# Patient Record
Sex: Female | Born: 1945
Health system: Southern US, Community
[De-identification: ages and names within clinical notes are randomized; demographics above are authoritative.]

## PROBLEM LIST (undated history)

## (undated) DIAGNOSIS — M1990 Unspecified osteoarthritis, unspecified site: Secondary | ICD-10-CM

## (undated) DIAGNOSIS — K7689 Other specified diseases of liver: Secondary | ICD-10-CM

## (undated) DIAGNOSIS — R51 Headache: Secondary | ICD-10-CM

## (undated) DIAGNOSIS — D649 Anemia, unspecified: Secondary | ICD-10-CM

## (undated) DIAGNOSIS — M5430 Sciatica, unspecified side: Secondary | ICD-10-CM

## (undated) DIAGNOSIS — G930 Cerebral cysts: Secondary | ICD-10-CM

## (undated) DIAGNOSIS — K5790 Diverticulosis of intestine, part unspecified, without perforation or abscess without bleeding: Secondary | ICD-10-CM

## (undated) DIAGNOSIS — N189 Chronic kidney disease, unspecified: Secondary | ICD-10-CM

## (undated) HISTORY — DX: Diverticulosis of intestine, part unspecified, without perforation or abscess without bleeding: K57.90

## (undated) HISTORY — PX: TUBAL LIGATION: SHX77

## (undated) HISTORY — DX: Unspecified osteoarthritis, unspecified site: M19.90

## (undated) HISTORY — DX: Other specified diseases of liver: K76.89

## (undated) HISTORY — DX: Cerebral cysts: G93.0

## (undated) HISTORY — PX: ABDOMINAL HYSTERECTOMY: SHX81

## (undated) HISTORY — DX: Sciatica, unspecified side: M54.30

## (undated) HISTORY — PX: CHOLECYSTECTOMY: SHX55

## (undated) HISTORY — PX: OTHER SURGICAL HISTORY: SHX169

---

## 1996-01-06 HISTORY — PX: BRAIN SURGERY: SHX531

## 1997-10-02 ENCOUNTER — Other Ambulatory Visit: Admission: RE | Admit: 1997-10-02 | Discharge: 1997-10-02 | Payer: Self-pay | Admitting: Obstetrics and Gynecology

## 2000-01-20 ENCOUNTER — Other Ambulatory Visit: Admission: RE | Admit: 2000-01-20 | Discharge: 2000-01-20 | Payer: Self-pay | Admitting: Obstetrics and Gynecology

## 2001-09-01 ENCOUNTER — Other Ambulatory Visit: Admission: RE | Admit: 2001-09-01 | Discharge: 2001-09-01 | Payer: Self-pay | Admitting: Obstetrics and Gynecology

## 2002-12-19 ENCOUNTER — Other Ambulatory Visit: Admission: RE | Admit: 2002-12-19 | Discharge: 2002-12-19 | Payer: Self-pay | Admitting: Obstetrics and Gynecology

## 2004-03-13 ENCOUNTER — Other Ambulatory Visit: Admission: RE | Admit: 2004-03-13 | Discharge: 2004-03-13 | Payer: Self-pay | Admitting: Obstetrics and Gynecology

## 2011-07-06 DIAGNOSIS — M722 Plantar fascial fibromatosis: Secondary | ICD-10-CM | POA: Diagnosis not present

## 2011-08-03 DIAGNOSIS — M722 Plantar fascial fibromatosis: Secondary | ICD-10-CM | POA: Diagnosis not present

## 2011-12-17 DIAGNOSIS — H2589 Other age-related cataract: Secondary | ICD-10-CM | POA: Diagnosis not present

## 2011-12-17 DIAGNOSIS — H04129 Dry eye syndrome of unspecified lacrimal gland: Secondary | ICD-10-CM | POA: Diagnosis not present

## 2012-01-23 ENCOUNTER — Emergency Department (HOSPITAL_COMMUNITY)
Admission: EM | Admit: 2012-01-23 | Discharge: 2012-01-23 | Disposition: A | Discharge status: Home / Self Care | Payer: Medicare Other | Attending: Emergency Medicine | Admitting: Emergency Medicine

## 2012-01-23 ENCOUNTER — Encounter (HOSPITAL_COMMUNITY): Payer: Self-pay | Admitting: *Deleted

## 2012-01-23 ENCOUNTER — Emergency Department (HOSPITAL_COMMUNITY): Payer: Medicare Other

## 2012-01-23 DIAGNOSIS — R197 Diarrhea, unspecified: Secondary | ICD-10-CM | POA: Diagnosis not present

## 2012-01-23 DIAGNOSIS — R112 Nausea with vomiting, unspecified: Secondary | ICD-10-CM | POA: Diagnosis not present

## 2012-01-23 DIAGNOSIS — R6883 Chills (without fever): Secondary | ICD-10-CM | POA: Diagnosis not present

## 2012-01-23 DIAGNOSIS — K8020 Calculus of gallbladder without cholecystitis without obstruction: Secondary | ICD-10-CM | POA: Diagnosis not present

## 2012-01-23 DIAGNOSIS — N201 Calculus of ureter: Secondary | ICD-10-CM | POA: Diagnosis not present

## 2012-01-23 DIAGNOSIS — R1031 Right lower quadrant pain: Secondary | ICD-10-CM | POA: Diagnosis not present

## 2012-01-23 DIAGNOSIS — N200 Calculus of kidney: Secondary | ICD-10-CM | POA: Diagnosis not present

## 2012-01-23 LAB — COMPREHENSIVE METABOLIC PANEL
ALT: 15 U/L (ref 0–35)
AST: 14 U/L (ref 0–37)
Albumin: 4.4 g/dL (ref 3.5–5.2)
Alkaline Phosphatase: 106 U/L (ref 39–117)
BUN: 13 mg/dL (ref 6–23)
CO2: 24 mEq/L (ref 19–32)
Calcium: 10.2 mg/dL (ref 8.4–10.5)
Chloride: 99 mEq/L (ref 96–112)
Creatinine, Ser: 0.75 mg/dL (ref 0.50–1.10)
GFR calc Af Amer: >90 mL/min (ref >90)
GFR calc non Af Amer: 85 mL/min — ABNORMAL LOW (ref >90)
Glucose, Bld: 137 mg/dL — ABNORMAL HIGH (ref 70–99)
Potassium: 4 mEq/L (ref 3.5–5.1)
Sodium: 138 mEq/L (ref 135–145)
Total Bilirubin: 0.6 mg/dL (ref 0.3–1.2)
Total Protein: 7.7 g/dL (ref 6.0–8.3)

## 2012-01-23 LAB — CBC WITH DIFFERENTIAL/PLATELET
Basophils Absolute: 0 10*3/uL (ref 0.0–0.1)
Basophils Relative: 0 % (ref 0–1)
Eosinophils Absolute: 0 10*3/uL (ref 0.0–0.7)
Eosinophils Relative: 0 % (ref 0–5)
HCT: 43.9 % (ref 36.0–46.0)
Hemoglobin: 15.2 g/dL — ABNORMAL HIGH (ref 12.0–15.0)
Lymphocytes Relative: 8 % — ABNORMAL LOW (ref 12–46)
Lymphs Abs: 0.9 10*3/uL (ref 0.7–4.0)
MCH: 28.7 pg (ref 26.0–34.0)
MCHC: 34.6 g/dL (ref 30.0–36.0)
MCV: 83 fL (ref 78.0–100.0)
Monocytes Absolute: 0.3 10*3/uL (ref 0.1–1.0)
Monocytes Relative: 3 % (ref 3–12)
Neutro Abs: 10.4 10*3/uL — ABNORMAL HIGH (ref 1.7–7.7)
Neutrophils Relative %: 89 % — ABNORMAL HIGH (ref 43–77)
Platelets: 233 10*3/uL (ref 150–400)
RBC: 5.29 MIL/uL — ABNORMAL HIGH (ref 3.87–5.11)
RDW: 13.3 % (ref 11.5–15.5)
WBC: 11.7 10*3/uL — ABNORMAL HIGH (ref 4.0–10.5)

## 2012-01-23 LAB — URINALYSIS, ROUTINE W REFLEX MICROSCOPIC
Bilirubin Urine: NEGATIVE
Glucose, UA: NEGATIVE mg/dL
Ketones, ur: 40 mg/dL — AB
Leukocytes, UA: NEGATIVE
Nitrite: NEGATIVE
Protein, ur: NEGATIVE mg/dL
Specific Gravity, Urine: 1.012 (ref 1.005–1.030)
Urobilinogen, UA: 0.2 mg/dL (ref 0.0–1.0)
pH: 7 (ref 5.0–8.0)

## 2012-01-23 LAB — URINE MICROSCOPIC-ADD ON

## 2012-01-23 LAB — LIPASE, BLOOD: Lipase: 14 U/L (ref 11–59)

## 2012-01-23 MED ORDER — ONDANSETRON 4 MG PO TBDP
8.0000 mg | ORAL_TABLET | Freq: Once | ORAL | Status: AC
  Administered 2012-01-23: 8 mg via ORAL
  Filled 2012-01-23: qty 2

## 2012-01-23 MED ORDER — IOHEXOL 300 MG/ML  SOLN
100.0000 mL | Freq: Once | INTRAMUSCULAR | Status: AC | PRN
  Administered 2012-01-23: 100 mL via INTRAVENOUS

## 2012-01-23 MED ORDER — IOHEXOL 300 MG/ML  SOLN
50.0000 mL | Freq: Once | INTRAMUSCULAR | Status: AC | PRN
  Administered 2012-01-23: 50 mL via ORAL

## 2012-01-23 MED ORDER — ONDANSETRON HCL 4 MG/2ML IJ SOLN
4.0000 mg | Freq: Once | INTRAMUSCULAR | Status: AC
  Administered 2012-01-23: 4 mg via INTRAVENOUS
  Filled 2012-01-23: qty 2

## 2012-01-23 MED ORDER — HYDROMORPHONE HCL PF 1 MG/ML IJ SOLN
1.0000 mg | Freq: Once | INTRAMUSCULAR | Status: AC
  Administered 2012-01-23: 1 mg via INTRAVENOUS
  Filled 2012-01-23: qty 1

## 2012-01-23 MED ORDER — OXYCODONE-ACETAMINOPHEN 5-325 MG PO TABS: 1.0000 | ORAL_TABLET | ORAL | Status: AC | PRN

## 2012-01-23 MED ORDER — TAMSULOSIN HCL 0.4 MG PO CAPS: 0.4000 mg | ORAL_CAPSULE | Freq: Every day | ORAL | Status: DC

## 2012-01-23 NOTE — ED Notes (Signed)
Pt was seen at [facility].  Was given 12.5 mg IM of phenergan at 0330.

## 2012-01-23 NOTE — ED Notes (Signed)
meds given for active dry heaving in triage

## 2012-01-23 NOTE — ED Notes (Addendum)
Pt reports right lower quadrant pain that started this am, constant in nature, associated with nausea/vomiting. Pt was seen at an urgent care prior to this and discharged. Pt reports no relief from meds given there. Reports she is still having nausea.

## 2012-01-23 NOTE — ED Notes (Signed)
Updated family on labs.  Pt with emesis x 2 since zofran.

## 2012-01-23 NOTE — ED Provider Notes (Signed)
History     CSN: [id number]  Arrival date & time 01/23/12  1627   First MD Initiated Contact with Patient 01/23/12 1908      Chief Complaint  Patient presents with  . Abdominal Pain  . Nausea    (Consider location/radiation/quality/duration/timing/severity/associated sxs/prior treatment) Patient is a 67 y.o. female presenting with abdominal pain. The history is provided by the patient and a relative. No language interpreter was used.  Abdominal Pain The primary symptoms of the illness include abdominal pain, nausea, vomiting and diarrhea. Primary symptoms comment: Patient has been sick since Thursday, 2 days ago. Her illness started with vomiting and diarrhea. Today she has severe right lower quadrant abdominal pain. The current episode started 2 days ago. The onset of the illness was gradual. The problem has been rapidly worsening.  Associated with: Nothing. The patient states that she believes she is currently not pregnant. The patient has not had a change in bowel habit. Risk factors for an acute abdominal problem include being elderly and a history of abdominal surgery (She has had a prior hysterectomy.). Additional symptoms associated with the illness include chills.    No past medical history on file.  No past surgical history on file.  No family history on file.  History  Substance Use Topics  . Smoking status: Not on file  . Smokeless tobacco: Not on file  . Alcohol Use: Not on file    OB History    No data available      Review of Systems  Constitutional: Positive for chills.  HENT: Negative.   Eyes: Negative.   Respiratory: Negative.   Gastrointestinal: Positive for nausea, vomiting, abdominal pain and diarrhea.  Genitourinary: Negative.   Musculoskeletal: Negative.   Skin: Negative.   Neurological: Negative.   Psychiatric/Behavioral: Negative.     Allergies  Other  Home Medications  No current outpatient prescriptions on file.  BP 107/79  Pulse  75  Temp 97.6 F (36.4 C) (Oral)  Resp 16  SpO2 99%  Physical Exam  Nursing note and vitals reviewed. Constitutional: She is oriented to person, place, and time. She appears well-developed and well-nourished.       Slender elderly woman in acute distress with abdominal pain.  HENT:  Head: Normocephalic and atraumatic.  Right Ear: External ear normal.  Left Ear: External ear normal.  Mouth/Throat: Oropharynx is clear and moist.  Eyes: Conjunctivae normal and EOM are normal. Pupils are equal, round, and reactive to light.  Neck: Normal range of motion. Neck supple.  Cardiovascular: Normal rate, regular rhythm and normal heart sounds.   Pulmonary/Chest: Effort normal and breath sounds normal.  Abdominal: Soft.       Has RLQ tenderness, no rebound or rigidity.   Musculoskeletal: She exhibits no edema.  Neurological: She is alert and oriented to person, place, and time.       No sensory or motor deficit.  Skin: Skin is warm and dry.  Psychiatric: She has a normal mood and affect. Her behavior is normal.    ED Course  Procedures (including critical care time)  Labs Reviewed  CBC WITH DIFFERENTIAL - Abnormal; Notable for the following:    WBC 11.7 (*)     RBC 5.29 (*)     Hemoglobin 15.2 (*)     Neutrophils Relative 89 (*)     Neutro Abs 10.4 (*)     Lymphocytes Relative 8 (*)     All other components within normal limits  COMPREHENSIVE METABOLIC PANEL -  Abnormal; Notable for the following:    Glucose, Bld 137 (*)     GFR calc non Af Amer 85 (*)     All other components within normal limits  LIPASE, BLOOD  URINALYSIS, MICROSCOPIC ONLY  URINALYSIS, ROUTINE W REFLEX MICROSCOPIC    7:25 PM Pt seen --> physical exam performed.  IV medications for pain and nausea ordered.  CT abdomen/pelvis with oral and IV contrast ordered.  Cath UA ordered.  9:14 PM Pt is in CT now.  I reviewed her lab results with her daughter.  Her WBC is high at 11,700.    10:19 PM CT shows right  proximal ureteral stone.  Case discussed with Dr. Berneice Heinrich, urologist on call --> pain meds, Flomax, strain urines, be seen at [facility] next week.  1. Kidney stone           Carleene Cooper III, MD 01/23/12 2220

## 2012-01-23 NOTE — ED Notes (Signed)
Pt O2 sat decreased to 80s while pt sleeping after med admin, pt awakens to verbal stimuli & O2 sats increase, pt placed on 2L [place], will reassess

## 2012-01-28 ENCOUNTER — Other Ambulatory Visit: Payer: Self-pay | Admitting: Urology

## 2012-01-28 DIAGNOSIS — N201 Calculus of ureter: Secondary | ICD-10-CM | POA: Diagnosis not present

## 2012-01-28 DIAGNOSIS — N200 Calculus of kidney: Secondary | ICD-10-CM | POA: Diagnosis not present

## 2012-01-29 ENCOUNTER — Encounter (HOSPITAL_COMMUNITY): Payer: Self-pay | Admitting: Pharmacy Technician

## 2012-01-29 ENCOUNTER — Encounter (HOSPITAL_COMMUNITY): Payer: Self-pay | Admitting: *Deleted

## 2012-01-29 NOTE — Pre-Procedure Instructions (Signed)
Asked to bring blue folder the day of the procedure,insurance card,I.D. driver's license,wear comfortable clothing and have a driver for the day. Asked not to take Advil,Motrin,Ibuprofen,Aleve or any NSAIDS, Aspirin, or Toradol for 72 hours prior to procedure,  No vitamins or herbal medications 7 days prior to procedure. Instructed to take laxative per doctor's office instructions and eat a light dinner the evening before procedure.   To arrive at 0530  for lithotripsy procedure. 

## 2012-01-31 NOTE — H&P (Signed)
ctive Problems Problems  1. Nephrolithiasis 592.0 2. Proximal Ureteral Stone On The Right 592.1  History of Present Illness  Glenda Matthews is a 67 yo WF who is sent from the ER for a right proximal stone.  She had some loose stools on Thursday but it progressed to severe right flank with nausea and vomiting on Saturday.  She was seen in the Urgent Care in [place] and then she was sent to the  ER and a CT was obtained that shows a 6x8x80mm stone in the left proximal urine with evidence of a forniceal rupture.  She was management medically in the ER and was discharged home.  She has only used percocet x 1 since discharge.  Her pain is not too bad today.  She has had some urgency but no hematuria.  Her UA today is unremarkable.  A KUB today shows a 10mm stone in the area of the right proximal ureter.  She did have a tiny RLP stone as well on the scan and an 18mm calcified gallstone.  She has not had prior stones or recent UTI's.  She has had no GU surgery.   Past Medical History Problems  1. History of  Arthritis V13.4 2. History of  Esophageal Reflux 530.81 3. History of  Hydrocephalus 331.4  Surgical History Problems  1. History of  Brain Surgery 2. History of  Brain Surgery 3. History of  Hysterectomy V45.77  Current Meds 1. Percocet TABS; Therapy: (Recorded:23Jan2014) to 2. Tamsulosin HCl 0.4 MG Oral Capsule; Therapy: (Recorded:23Jan2014) to  Allergies Medication  1. PredniSONE TABS  Family History Problems  1. Maternal history of  Breast Cancer V16.3 2. Family history of  Death In The Family Father age 53 of heart disease 3. Son's history of  Diverticulitis Of Colon 4. Family history of  Family Health Status Number Of Children 1 son   2 daughters 5. Paternal history of  Heart Disease V17.49 6. Son's history of  Hernia 7. Daughter's history of  Type 1 Diabetes Mellitus  Social History Problems    Alcohol Use rarely   Caffeine Use rarely   Marital History - Widowed    Occupation: Retired Denied    History of  Tobacco Use 305.1  Review of Systems Genitourinary, constitutional, skin, eye, otolaryngeal, hematologic/lymphatic, cardiovascular, pulmonary, endocrine, musculoskeletal, gastrointestinal, neurological and psychiatric system(s) were reviewed and pertinent findings if present are noted.  Genitourinary: urinary frequency, urinary urgency and incontinence.  Gastrointestinal: nausea, vomiting, heartburn and diarrhea.  Constitutional: night sweats and feeling tired (fatigue).  ENT: sinus problems.  Musculoskeletal: joint pain.  Neurological: dizziness.  Psychiatric: anxiety.    Vitals Vital Signs [Data Includes: Last 1 Day]  23Jan2014 02:28PM  Blood Pressure: 136 / 86 Temperature: 97.2 F Heart Rate: 101 23Jan2014 02:22PM  BMI Calculated: 25.58 BSA Calculated: 1.63 Height: 5 ft 2 in Weight: 139 lb   Physical Exam Constitutional: Well nourished and well developed . No acute distress.  ENT:. The ears and nose are normal in appearance.  Neck: The appearance of the neck is normal and no neck mass is present.  Pulmonary: No respiratory distress and normal respiratory rhythm and effort.  Cardiovascular: Heart rate and rhythm are normal . No peripheral edema.  Abdomen: The abdomen is soft and nontender. No masses are palpated. No CVA tenderness. No hernias are palpable. No hepatosplenomegaly noted.  Lymphatics: The supraclavicular nodes are not enlarged or tender.  Skin: Normal skin turgor, no visible rash and no visible skin lesions.  Neuro/Psych:. Mood  and affect are appropriate.    Results/Data Urine [Data Includes: Last 1 Day]   23Jan2014  COLOR YELLOW   APPEARANCE CLEAR   SPECIFIC GRAVITY 1.010   pH 6.0   GLUCOSE NEG mg/dL  BILIRUBIN NEG   KETONE NEG mg/dL  BLOOD SMALL   PROTEIN NEG mg/dL  UROBILINOGEN 0.2 mg/dL  NITRITE NEG   LEUKOCYTE ESTERASE NEG   SQUAMOUS EPITHELIAL/HPF NONE SEEN   WBC NONE SEEN WBC/hpf  RBC 0-2 RBC/hpf    BACTERIA NONE SEEN   CRYSTALS NONE SEEN   CASTS NONE SEEN    Old records or history reviewed: I have reviewed her ER records and labs.  The following images/tracing/specimen were independently visualized:  I have reviewed her CT films from her recent visit and her KUB today shows a stable 10mm left proximal stone. There is retained contrast in the sigmoid colon with tics. there is a calcification in the RUQ that is probably her gallstone. The film is otherwise unremarkable.    Assessment Assessed  1. Proximal Ureteral Stone On The Right 592.1 2. Nephrolithiasis 592.0   She has an 6x8x17mm right proximal stone with minimal symptoms currently.   Plan Nephrolithiasis (592.0)  1. KUB  Done: 23Jan2014 12:00AM 2. Follow-up Schedule Surgery Office  Follow-up  Done: 23Jan2014 Proximal Ureteral Stone On The Right (592.1)  3. Ondansetron 4 MG Oral Tablet Dispersible; TAKE 1 TABLET Every 6 hours PRN nausea can take  1-2 tabs; Therapy: 23Jan2014 to (Last Rx:23Jan2014)  Requested for: 23Jan2014; Edited 4. Tamsulosin HCl 0.4 MG Oral Capsule; TAKE 1 CAPSULE Daily with meals; Therapy: 23Jan2014  to (Evaluate:20Feb2014)  Requested for: 23Jan2014; Last Rx:23Jan2014; Edited   I have discussed the options for therapy.  I don't think this stone will pass so MET is not appropriate.   I have discussed both ESWL and Ureteroscopy in detail and believe she would be a good candidate for ESWL.  I reviewed the risks of bleeding,infection, injury to the kidney or adjacent structures that can result in serious and even life threatening complications, failure of fragmentation or obstructing fragments possibly requiring secondary procedures, thrombotic events and sedation risks. I will try to set her up for Monday.  She has percocet and I will send in Zofran and tamsulosin to aid fragment passage.

## 2012-02-01 ENCOUNTER — Ambulatory Visit (HOSPITAL_COMMUNITY): Payer: Medicare Other

## 2012-02-01 ENCOUNTER — Ambulatory Visit (HOSPITAL_COMMUNITY)
Admission: RE | Admit: 2012-02-01 | Discharge: 2012-02-01 | Disposition: A | Discharge status: Home / Self Care | Payer: Medicare Other | Source: Ambulatory Visit | Attending: Urology | Admitting: Urology

## 2012-02-01 ENCOUNTER — Encounter (HOSPITAL_COMMUNITY): Payer: Self-pay | Admitting: *Deleted

## 2012-02-01 ENCOUNTER — Encounter (HOSPITAL_COMMUNITY)
Admission: RE | Disposition: A | Discharge status: Home / Self Care | Payer: Self-pay | Source: Ambulatory Visit | Attending: Urology

## 2012-02-01 DIAGNOSIS — Z01818 Encounter for other preprocedural examination: Secondary | ICD-10-CM | POA: Diagnosis not present

## 2012-02-01 DIAGNOSIS — G911 Obstructive hydrocephalus: Secondary | ICD-10-CM | POA: Diagnosis not present

## 2012-02-01 DIAGNOSIS — Z79899 Other long term (current) drug therapy: Secondary | ICD-10-CM | POA: Diagnosis not present

## 2012-02-01 DIAGNOSIS — I499 Cardiac arrhythmia, unspecified: Secondary | ICD-10-CM | POA: Diagnosis not present

## 2012-02-01 DIAGNOSIS — K219 Gastro-esophageal reflux disease without esophagitis: Secondary | ICD-10-CM | POA: Diagnosis not present

## 2012-02-01 DIAGNOSIS — N201 Calculus of ureter: Secondary | ICD-10-CM | POA: Diagnosis not present

## 2012-02-01 DIAGNOSIS — N200 Calculus of kidney: Secondary | ICD-10-CM | POA: Insufficient documentation

## 2012-02-01 HISTORY — DX: Headache: R51

## 2012-02-01 HISTORY — DX: Anemia, unspecified: D64.9

## 2012-02-01 HISTORY — DX: Chronic kidney disease, unspecified: N18.9

## 2012-02-01 HISTORY — DX: Unspecified osteoarthritis, unspecified site: M19.90

## 2012-02-01 SURGERY — LITHOTRIPSY, ESWL
Anesthesia: LOCAL | Laterality: Right

## 2012-02-01 MED ORDER — SODIUM CHLORIDE 0.9 % IV SOLN: 250.0000 mL | INTRAVENOUS | Status: DC | PRN

## 2012-02-01 MED ORDER — OXYCODONE HCL 5 MG PO TABS: 5.0000 mg | ORAL_TABLET | ORAL | Status: DC | PRN

## 2012-02-01 MED ORDER — SODIUM CHLORIDE 0.9 % IJ SOLN: 3.0000 mL | Freq: Two times a day (BID) | INTRAMUSCULAR | Status: DC

## 2012-02-01 MED ORDER — DEXTROSE-NACL 5-0.45 % IV SOLN
INTRAVENOUS | Status: DC
  Administered 2012-02-01: 07:00:00 via INTRAVENOUS

## 2012-02-01 MED ORDER — FENTANYL CITRATE 0.05 MG/ML IJ SOLN: 25.0000 ug | INTRAMUSCULAR | Status: DC | PRN

## 2012-02-01 MED ORDER — ONDANSETRON HCL 4 MG/2ML IJ SOLN: 4.0000 mg | Freq: Four times a day (QID) | INTRAMUSCULAR | Status: DC | PRN

## 2012-02-01 MED ORDER — ACETAMINOPHEN 325 MG PO TABS: 650.0000 mg | ORAL_TABLET | ORAL | Status: DC | PRN

## 2012-02-01 MED ORDER — ACETAMINOPHEN 650 MG RE SUPP: 650.0000 mg | RECTAL | Status: DC | PRN

## 2012-02-01 MED ORDER — DIPHENHYDRAMINE HCL 25 MG PO CAPS
25.0000 mg | ORAL_CAPSULE | ORAL | Status: AC
  Administered 2012-02-01: 25 mg via ORAL
  Filled 2012-02-01: qty 1

## 2012-02-01 MED ORDER — SODIUM CHLORIDE 0.9 % IJ SOLN: 3.0000 mL | INTRAMUSCULAR | Status: DC | PRN

## 2012-02-01 MED ORDER — DIAZEPAM 5 MG PO TABS
10.0000 mg | ORAL_TABLET | ORAL | Status: AC
  Administered 2012-02-01: 10 mg via ORAL
  Filled 2012-02-01: qty 2

## 2012-02-01 MED ORDER — CIPROFLOXACIN HCL 500 MG PO TABS
500.0000 mg | ORAL_TABLET | ORAL | Status: AC
Start: 2012-02-01 — End: 2012-02-01
  Administered 2012-02-01: 500 mg via ORAL
  Filled 2012-02-01: qty 1

## 2012-02-01 NOTE — Progress Notes (Signed)
Patient up to restroom without complaints. Alert and Oriented x4. Slightly groggy, but states she feel fine and denies pain. Patient and patient's daughter verbalize understand of discharge instructions. Instructed to call Dr. Annabell Howells for any concerns following discharge home.

## 2012-02-01 NOTE — Interval H&P Note (Signed)
History and Physical Interval Note:  02/01/2012 7:38 AM  Glenda Matthews  has presented today for surgery, with the diagnosis of RIGHT PROXIMAL STONE  The various methods of treatment have been discussed with the patient and family. After consideration of risks, benefits and other options for treatment, the patient has consented to  Procedure(s) (LRB) with comments: EXTRACORPOREAL SHOCK WAVE LITHOTRIPSY (ESWL) (Right) as a surgical intervention .  The patient's history has been reviewed, patient examined, no change in status, stable for surgery.  I have reviewed the patient's chart and labs.  Questions were answered to the patient's satisfaction.     [name] J  Her stone has dropped back into the RLP on KUB this morning.

## 2012-02-15 DIAGNOSIS — N200 Calculus of kidney: Secondary | ICD-10-CM | POA: Diagnosis not present

## 2012-02-15 DIAGNOSIS — N201 Calculus of ureter: Secondary | ICD-10-CM | POA: Diagnosis not present

## 2012-03-10 DIAGNOSIS — Z1289 Encounter for screening for malignant neoplasm of other sites: Secondary | ICD-10-CM | POA: Diagnosis not present

## 2012-03-10 DIAGNOSIS — R109 Unspecified abdominal pain: Secondary | ICD-10-CM | POA: Diagnosis not present

## 2012-03-10 DIAGNOSIS — Z1231 Encounter for screening mammogram for malignant neoplasm of breast: Secondary | ICD-10-CM | POA: Diagnosis not present

## 2012-03-18 DIAGNOSIS — K5730 Diverticulosis of large intestine without perforation or abscess without bleeding: Secondary | ICD-10-CM | POA: Diagnosis not present

## 2012-03-18 DIAGNOSIS — R109 Unspecified abdominal pain: Secondary | ICD-10-CM | POA: Diagnosis not present

## 2012-03-18 DIAGNOSIS — R932 Abnormal findings on diagnostic imaging of liver and biliary tract: Secondary | ICD-10-CM | POA: Diagnosis not present

## 2012-04-13 DIAGNOSIS — K5732 Diverticulitis of large intestine without perforation or abscess without bleeding: Secondary | ICD-10-CM | POA: Diagnosis not present

## 2012-04-13 DIAGNOSIS — R933 Abnormal findings on diagnostic imaging of other parts of digestive tract: Secondary | ICD-10-CM | POA: Diagnosis not present

## 2012-04-13 DIAGNOSIS — K5730 Diverticulosis of large intestine without perforation or abscess without bleeding: Secondary | ICD-10-CM | POA: Diagnosis not present

## 2012-08-29 DIAGNOSIS — K5730 Diverticulosis of large intestine without perforation or abscess without bleeding: Secondary | ICD-10-CM | POA: Diagnosis not present

## 2012-08-29 DIAGNOSIS — R932 Abnormal findings on diagnostic imaging of liver and biliary tract: Secondary | ICD-10-CM | POA: Diagnosis not present

## 2012-08-29 DIAGNOSIS — R109 Unspecified abdominal pain: Secondary | ICD-10-CM | POA: Diagnosis not present

## 2012-11-02 DIAGNOSIS — N760 Acute vaginitis: Secondary | ICD-10-CM | POA: Diagnosis not present

## 2012-11-02 DIAGNOSIS — D280 Benign neoplasm of vulva: Secondary | ICD-10-CM | POA: Diagnosis not present

## 2012-11-02 DIAGNOSIS — L909 Atrophic disorder of skin, unspecified: Secondary | ICD-10-CM | POA: Diagnosis not present

## 2012-11-04 DIAGNOSIS — M5430 Sciatica, unspecified side: Secondary | ICD-10-CM | POA: Diagnosis not present

## 2012-11-07 DIAGNOSIS — K5730 Diverticulosis of large intestine without perforation or abscess without bleeding: Secondary | ICD-10-CM | POA: Diagnosis not present

## 2012-11-07 DIAGNOSIS — R109 Unspecified abdominal pain: Secondary | ICD-10-CM | POA: Diagnosis not present

## 2012-11-07 DIAGNOSIS — R932 Abnormal findings on diagnostic imaging of liver and biliary tract: Secondary | ICD-10-CM | POA: Diagnosis not present

## 2012-12-19 DIAGNOSIS — J019 Acute sinusitis, unspecified: Secondary | ICD-10-CM | POA: Diagnosis not present

## 2012-12-26 DIAGNOSIS — H2589 Other age-related cataract: Secondary | ICD-10-CM | POA: Diagnosis not present

## 2012-12-26 DIAGNOSIS — H18519 Endothelial corneal dystrophy, unspecified eye: Secondary | ICD-10-CM | POA: Diagnosis not present

## 2012-12-26 DIAGNOSIS — H33309 Unspecified retinal break, unspecified eye: Secondary | ICD-10-CM | POA: Diagnosis not present

## 2012-12-26 DIAGNOSIS — H43819 Vitreous degeneration, unspecified eye: Secondary | ICD-10-CM | POA: Diagnosis not present

## 2013-02-01 DIAGNOSIS — H2510 Age-related nuclear cataract, unspecified eye: Secondary | ICD-10-CM | POA: Diagnosis not present

## 2013-02-01 DIAGNOSIS — H35439 Paving stone degeneration of retina, unspecified eye: Secondary | ICD-10-CM | POA: Diagnosis not present

## 2013-02-01 DIAGNOSIS — H43819 Vitreous degeneration, unspecified eye: Secondary | ICD-10-CM | POA: Diagnosis not present

## 2013-02-14 DIAGNOSIS — H33309 Unspecified retinal break, unspecified eye: Secondary | ICD-10-CM | POA: Diagnosis not present

## 2013-02-14 DIAGNOSIS — H2589 Other age-related cataract: Secondary | ICD-10-CM | POA: Diagnosis not present

## 2013-02-14 DIAGNOSIS — H18519 Endothelial corneal dystrophy, unspecified eye: Secondary | ICD-10-CM | POA: Diagnosis not present

## 2013-02-14 DIAGNOSIS — H31009 Unspecified chorioretinal scars, unspecified eye: Secondary | ICD-10-CM | POA: Diagnosis not present

## 2014-05-11 DIAGNOSIS — H26051 Posterior subcapsular polar infantile and juvenile cataract, right eye: Secondary | ICD-10-CM | POA: Diagnosis not present

## 2014-05-11 DIAGNOSIS — H52223 Regular astigmatism, bilateral: Secondary | ICD-10-CM | POA: Diagnosis not present

## 2014-05-11 DIAGNOSIS — H43813 Vitreous degeneration, bilateral: Secondary | ICD-10-CM | POA: Diagnosis not present

## 2014-05-11 DIAGNOSIS — H5053 Vertical heterophoria: Secondary | ICD-10-CM | POA: Diagnosis not present

## 2014-05-11 DIAGNOSIS — H43313 Vitreous membranes and strands, bilateral: Secondary | ICD-10-CM | POA: Diagnosis not present

## 2014-05-11 DIAGNOSIS — H524 Presbyopia: Secondary | ICD-10-CM | POA: Diagnosis not present

## 2014-05-11 DIAGNOSIS — H25812 Combined forms of age-related cataract, left eye: Secondary | ICD-10-CM | POA: Diagnosis not present

## 2014-05-22 DIAGNOSIS — Z982 Presence of cerebrospinal fluid drainage device: Secondary | ICD-10-CM | POA: Diagnosis not present

## 2014-05-22 DIAGNOSIS — R12 Heartburn: Secondary | ICD-10-CM | POA: Diagnosis not present

## 2014-05-22 DIAGNOSIS — R079 Chest pain, unspecified: Secondary | ICD-10-CM | POA: Diagnosis not present

## 2014-06-08 DIAGNOSIS — R51 Headache: Secondary | ICD-10-CM | POA: Diagnosis not present

## 2014-06-08 DIAGNOSIS — R42 Dizziness and giddiness: Secondary | ICD-10-CM | POA: Diagnosis not present

## 2014-06-14 DIAGNOSIS — R5383 Other fatigue: Secondary | ICD-10-CM | POA: Diagnosis not present

## 2014-06-14 DIAGNOSIS — Z0001 Encounter for general adult medical examination with abnormal findings: Secondary | ICD-10-CM | POA: Diagnosis not present

## 2014-06-14 DIAGNOSIS — Z982 Presence of cerebrospinal fluid drainage device: Secondary | ICD-10-CM | POA: Diagnosis not present

## 2014-06-14 DIAGNOSIS — R51 Headache: Secondary | ICD-10-CM | POA: Diagnosis not present

## 2014-06-14 DIAGNOSIS — Z13220 Encounter for screening for lipoid disorders: Secondary | ICD-10-CM | POA: Diagnosis not present

## 2014-06-14 DIAGNOSIS — R42 Dizziness and giddiness: Secondary | ICD-10-CM | POA: Diagnosis not present

## 2014-07-18 DIAGNOSIS — D485 Neoplasm of uncertain behavior of skin: Secondary | ICD-10-CM | POA: Diagnosis not present

## 2014-08-14 DIAGNOSIS — C44519 Basal cell carcinoma of skin of other part of trunk: Secondary | ICD-10-CM | POA: Diagnosis not present

## 2014-08-14 DIAGNOSIS — L821 Other seborrheic keratosis: Secondary | ICD-10-CM | POA: Diagnosis not present

## 2014-08-14 DIAGNOSIS — D2339 Other benign neoplasm of skin of other parts of face: Secondary | ICD-10-CM | POA: Diagnosis not present

## 2014-08-14 DIAGNOSIS — D485 Neoplasm of uncertain behavior of skin: Secondary | ICD-10-CM | POA: Diagnosis not present

## 2014-08-14 DIAGNOSIS — D225 Melanocytic nevi of trunk: Secondary | ICD-10-CM | POA: Diagnosis not present

## 2014-08-20 DIAGNOSIS — D485 Neoplasm of uncertain behavior of skin: Secondary | ICD-10-CM | POA: Diagnosis not present

## 2014-11-21 DIAGNOSIS — Z85828 Personal history of other malignant neoplasm of skin: Secondary | ICD-10-CM | POA: Diagnosis not present

## 2014-11-21 DIAGNOSIS — Z08 Encounter for follow-up examination after completed treatment for malignant neoplasm: Secondary | ICD-10-CM | POA: Diagnosis not present

## 2014-11-21 DIAGNOSIS — L249 Irritant contact dermatitis, unspecified cause: Secondary | ICD-10-CM | POA: Diagnosis not present

## 2015-02-21 DIAGNOSIS — R1031 Right lower quadrant pain: Secondary | ICD-10-CM | POA: Diagnosis not present

## 2015-03-19 DIAGNOSIS — R0602 Shortness of breath: Secondary | ICD-10-CM | POA: Diagnosis not present

## 2015-03-19 DIAGNOSIS — R0789 Other chest pain: Secondary | ICD-10-CM | POA: Diagnosis not present

## 2015-03-19 DIAGNOSIS — R072 Precordial pain: Secondary | ICD-10-CM | POA: Diagnosis not present

## 2015-03-19 DIAGNOSIS — K219 Gastro-esophageal reflux disease without esophagitis: Secondary | ICD-10-CM | POA: Diagnosis not present

## 2015-03-19 DIAGNOSIS — R911 Solitary pulmonary nodule: Secondary | ICD-10-CM | POA: Diagnosis not present

## 2015-03-19 DIAGNOSIS — K5730 Diverticulosis of large intestine without perforation or abscess without bleeding: Secondary | ICD-10-CM | POA: Diagnosis not present

## 2015-03-19 DIAGNOSIS — K8020 Calculus of gallbladder without cholecystitis without obstruction: Secondary | ICD-10-CM | POA: Diagnosis not present

## 2015-03-19 DIAGNOSIS — E785 Hyperlipidemia, unspecified: Secondary | ICD-10-CM | POA: Diagnosis not present

## 2015-03-19 DIAGNOSIS — R079 Chest pain, unspecified: Secondary | ICD-10-CM | POA: Diagnosis not present

## 2015-03-20 DIAGNOSIS — R079 Chest pain, unspecified: Secondary | ICD-10-CM | POA: Diagnosis not present

## 2015-03-28 DIAGNOSIS — K8020 Calculus of gallbladder without cholecystitis without obstruction: Secondary | ICD-10-CM | POA: Diagnosis not present

## 2015-04-10 DIAGNOSIS — R1013 Epigastric pain: Secondary | ICD-10-CM | POA: Diagnosis not present

## 2015-04-10 DIAGNOSIS — K811 Chronic cholecystitis: Secondary | ICD-10-CM | POA: Diagnosis not present

## 2015-04-10 DIAGNOSIS — R110 Nausea: Secondary | ICD-10-CM | POA: Diagnosis not present

## 2015-04-10 DIAGNOSIS — R1011 Right upper quadrant pain: Secondary | ICD-10-CM | POA: Diagnosis not present

## 2015-04-11 DIAGNOSIS — Z01818 Encounter for other preprocedural examination: Secondary | ICD-10-CM | POA: Diagnosis not present

## 2015-04-15 DIAGNOSIS — R1013 Epigastric pain: Secondary | ICD-10-CM | POA: Diagnosis not present

## 2015-04-15 DIAGNOSIS — N189 Chronic kidney disease, unspecified: Secondary | ICD-10-CM | POA: Diagnosis not present

## 2015-04-15 DIAGNOSIS — Z87891 Personal history of nicotine dependence: Secondary | ICD-10-CM | POA: Diagnosis not present

## 2015-04-15 DIAGNOSIS — Z79899 Other long term (current) drug therapy: Secondary | ICD-10-CM | POA: Diagnosis not present

## 2015-04-15 DIAGNOSIS — K8010 Calculus of gallbladder with chronic cholecystitis without obstruction: Secondary | ICD-10-CM | POA: Diagnosis not present

## 2015-04-15 DIAGNOSIS — K811 Chronic cholecystitis: Secondary | ICD-10-CM | POA: Diagnosis not present

## 2015-04-15 DIAGNOSIS — K219 Gastro-esophageal reflux disease without esophagitis: Secondary | ICD-10-CM | POA: Diagnosis not present

## 2015-04-16 DIAGNOSIS — Z87891 Personal history of nicotine dependence: Secondary | ICD-10-CM | POA: Diagnosis not present

## 2015-04-16 DIAGNOSIS — K8010 Calculus of gallbladder with chronic cholecystitis without obstruction: Secondary | ICD-10-CM | POA: Diagnosis not present

## 2015-04-16 DIAGNOSIS — K219 Gastro-esophageal reflux disease without esophagitis: Secondary | ICD-10-CM | POA: Diagnosis not present

## 2015-04-16 DIAGNOSIS — Z79899 Other long term (current) drug therapy: Secondary | ICD-10-CM | POA: Diagnosis not present

## 2015-04-16 DIAGNOSIS — N189 Chronic kidney disease, unspecified: Secondary | ICD-10-CM | POA: Diagnosis not present

## 2015-07-23 DIAGNOSIS — J0101 Acute recurrent maxillary sinusitis: Secondary | ICD-10-CM | POA: Diagnosis not present

## 2015-07-23 DIAGNOSIS — M5431 Sciatica, right side: Secondary | ICD-10-CM | POA: Diagnosis not present

## 2015-08-01 DIAGNOSIS — C44519 Basal cell carcinoma of skin of other part of trunk: Secondary | ICD-10-CM | POA: Diagnosis not present

## 2015-09-06 DIAGNOSIS — R1030 Lower abdominal pain, unspecified: Secondary | ICD-10-CM | POA: Diagnosis not present

## 2015-09-06 DIAGNOSIS — R300 Dysuria: Secondary | ICD-10-CM | POA: Diagnosis not present

## 2015-09-06 DIAGNOSIS — R911 Solitary pulmonary nodule: Secondary | ICD-10-CM | POA: Diagnosis not present

## 2015-10-16 DIAGNOSIS — D1801 Hemangioma of skin and subcutaneous tissue: Secondary | ICD-10-CM | POA: Diagnosis not present

## 2015-10-16 DIAGNOSIS — Z85828 Personal history of other malignant neoplasm of skin: Secondary | ICD-10-CM | POA: Diagnosis not present

## 2015-10-16 DIAGNOSIS — D485 Neoplasm of uncertain behavior of skin: Secondary | ICD-10-CM | POA: Diagnosis not present

## 2015-10-16 DIAGNOSIS — L821 Other seborrheic keratosis: Secondary | ICD-10-CM | POA: Diagnosis not present

## 2015-10-16 DIAGNOSIS — L814 Other melanin hyperpigmentation: Secondary | ICD-10-CM | POA: Diagnosis not present

## 2015-10-16 DIAGNOSIS — L570 Actinic keratosis: Secondary | ICD-10-CM | POA: Diagnosis not present

## 2015-10-21 DIAGNOSIS — I700 Atherosclerosis of aorta: Secondary | ICD-10-CM | POA: Diagnosis not present

## 2015-10-21 DIAGNOSIS — R911 Solitary pulmonary nodule: Secondary | ICD-10-CM | POA: Diagnosis not present

## 2015-12-19 DIAGNOSIS — L570 Actinic keratosis: Secondary | ICD-10-CM | POA: Diagnosis not present

## 2016-02-07 DIAGNOSIS — R109 Unspecified abdominal pain: Secondary | ICD-10-CM | POA: Diagnosis not present

## 2016-02-07 DIAGNOSIS — R197 Diarrhea, unspecified: Secondary | ICD-10-CM | POA: Diagnosis not present

## 2016-02-07 DIAGNOSIS — E860 Dehydration: Secondary | ICD-10-CM | POA: Diagnosis not present

## 2016-03-19 DIAGNOSIS — Z982 Presence of cerebrospinal fluid drainage device: Secondary | ICD-10-CM | POA: Diagnosis not present

## 2016-03-19 DIAGNOSIS — R42 Dizziness and giddiness: Secondary | ICD-10-CM | POA: Diagnosis not present

## 2016-03-19 DIAGNOSIS — R1011 Right upper quadrant pain: Secondary | ICD-10-CM | POA: Diagnosis not present

## 2016-03-24 DIAGNOSIS — R109 Unspecified abdominal pain: Secondary | ICD-10-CM | POA: Diagnosis not present

## 2016-03-24 DIAGNOSIS — R110 Nausea: Secondary | ICD-10-CM | POA: Diagnosis not present

## 2016-04-01 DIAGNOSIS — G919 Hydrocephalus, unspecified: Secondary | ICD-10-CM | POA: Diagnosis not present

## 2016-04-01 DIAGNOSIS — Z6827 Body mass index (BMI) 27.0-27.9, adult: Secondary | ICD-10-CM | POA: Diagnosis not present

## 2016-04-02 ENCOUNTER — Other Ambulatory Visit: Payer: Self-pay | Admitting: Neurosurgery

## 2016-04-02 DIAGNOSIS — G919 Hydrocephalus, unspecified: Secondary | ICD-10-CM

## 2016-04-03 ENCOUNTER — Ambulatory Visit
Admission: RE | Admit: 2016-04-03 | Discharge: 2016-04-03 | Disposition: A | Discharge status: Home / Self Care | Payer: Medicare Other | Source: Ambulatory Visit | Attending: Neurosurgery | Admitting: Neurosurgery

## 2016-04-03 DIAGNOSIS — G919 Hydrocephalus, unspecified: Secondary | ICD-10-CM

## 2016-04-03 DIAGNOSIS — R51 Headache: Secondary | ICD-10-CM | POA: Diagnosis not present

## 2016-04-15 DIAGNOSIS — D485 Neoplasm of uncertain behavior of skin: Secondary | ICD-10-CM | POA: Diagnosis not present

## 2016-04-15 DIAGNOSIS — L821 Other seborrheic keratosis: Secondary | ICD-10-CM | POA: Diagnosis not present

## 2016-04-15 DIAGNOSIS — Z85828 Personal history of other malignant neoplasm of skin: Secondary | ICD-10-CM | POA: Diagnosis not present

## 2016-04-15 DIAGNOSIS — L570 Actinic keratosis: Secondary | ICD-10-CM | POA: Diagnosis not present

## 2016-04-15 DIAGNOSIS — C4441 Basal cell carcinoma of skin of scalp and neck: Secondary | ICD-10-CM | POA: Diagnosis not present

## 2016-04-15 DIAGNOSIS — L814 Other melanin hyperpigmentation: Secondary | ICD-10-CM | POA: Diagnosis not present

## 2016-04-15 DIAGNOSIS — C44519 Basal cell carcinoma of skin of other part of trunk: Secondary | ICD-10-CM | POA: Diagnosis not present

## 2016-04-15 DIAGNOSIS — D1801 Hemangioma of skin and subcutaneous tissue: Secondary | ICD-10-CM | POA: Diagnosis not present

## 2016-04-22 DIAGNOSIS — G919 Hydrocephalus, unspecified: Secondary | ICD-10-CM | POA: Diagnosis not present

## 2016-05-07 DIAGNOSIS — I700 Atherosclerosis of aorta: Secondary | ICD-10-CM | POA: Diagnosis not present

## 2016-05-07 DIAGNOSIS — R109 Unspecified abdominal pain: Secondary | ICD-10-CM | POA: Diagnosis not present

## 2016-05-07 DIAGNOSIS — N281 Cyst of kidney, acquired: Secondary | ICD-10-CM | POA: Diagnosis not present

## 2016-05-07 DIAGNOSIS — K7689 Other specified diseases of liver: Secondary | ICD-10-CM | POA: Diagnosis not present

## 2016-05-07 DIAGNOSIS — Z79899 Other long term (current) drug therapy: Secondary | ICD-10-CM | POA: Diagnosis not present

## 2016-06-22 DIAGNOSIS — C4441 Basal cell carcinoma of skin of scalp and neck: Secondary | ICD-10-CM | POA: Diagnosis not present

## 2016-06-22 DIAGNOSIS — C44519 Basal cell carcinoma of skin of other part of trunk: Secondary | ICD-10-CM | POA: Diagnosis not present

## 2016-06-22 DIAGNOSIS — L905 Scar conditions and fibrosis of skin: Secondary | ICD-10-CM | POA: Diagnosis not present

## 2016-06-25 DIAGNOSIS — L905 Scar conditions and fibrosis of skin: Secondary | ICD-10-CM | POA: Diagnosis not present

## 2016-07-24 ENCOUNTER — Other Ambulatory Visit: Payer: Self-pay

## 2016-08-13 DIAGNOSIS — R911 Solitary pulmonary nodule: Secondary | ICD-10-CM | POA: Diagnosis not present

## 2016-08-13 DIAGNOSIS — N281 Cyst of kidney, acquired: Secondary | ICD-10-CM | POA: Diagnosis not present

## 2016-08-13 DIAGNOSIS — R1030 Lower abdominal pain, unspecified: Secondary | ICD-10-CM | POA: Diagnosis not present

## 2016-08-13 DIAGNOSIS — R197 Diarrhea, unspecified: Secondary | ICD-10-CM | POA: Diagnosis not present

## 2016-08-13 DIAGNOSIS — K7689 Other specified diseases of liver: Secondary | ICD-10-CM | POA: Diagnosis not present

## 2016-11-09 DIAGNOSIS — H524 Presbyopia: Secondary | ICD-10-CM | POA: Diagnosis not present

## 2016-11-09 DIAGNOSIS — H25813 Combined forms of age-related cataract, bilateral: Secondary | ICD-10-CM | POA: Diagnosis not present

## 2016-11-09 DIAGNOSIS — H5211 Myopia, right eye: Secondary | ICD-10-CM | POA: Diagnosis not present

## 2016-11-09 DIAGNOSIS — R911 Solitary pulmonary nodule: Secondary | ICD-10-CM | POA: Diagnosis not present

## 2016-11-09 DIAGNOSIS — H52223 Regular astigmatism, bilateral: Secondary | ICD-10-CM | POA: Diagnosis not present

## 2016-11-09 DIAGNOSIS — I700 Atherosclerosis of aorta: Secondary | ICD-10-CM | POA: Diagnosis not present

## 2016-11-17 DIAGNOSIS — J069 Acute upper respiratory infection, unspecified: Secondary | ICD-10-CM | POA: Diagnosis not present

## 2016-11-17 DIAGNOSIS — K219 Gastro-esophageal reflux disease without esophagitis: Secondary | ICD-10-CM | POA: Diagnosis not present

## 2016-11-17 DIAGNOSIS — M549 Dorsalgia, unspecified: Secondary | ICD-10-CM | POA: Diagnosis not present

## 2016-11-17 DIAGNOSIS — R05 Cough: Secondary | ICD-10-CM | POA: Diagnosis not present

## 2017-01-14 DIAGNOSIS — H52223 Regular astigmatism, bilateral: Secondary | ICD-10-CM | POA: Diagnosis not present

## 2017-01-14 DIAGNOSIS — H1851 Endothelial corneal dystrophy: Secondary | ICD-10-CM | POA: Diagnosis not present

## 2017-01-14 DIAGNOSIS — H5213 Myopia, bilateral: Secondary | ICD-10-CM | POA: Diagnosis not present

## 2017-01-14 DIAGNOSIS — H524 Presbyopia: Secondary | ICD-10-CM | POA: Diagnosis not present

## 2017-01-14 DIAGNOSIS — H25813 Combined forms of age-related cataract, bilateral: Secondary | ICD-10-CM | POA: Diagnosis not present

## 2017-01-14 DIAGNOSIS — H25093 Other age-related incipient cataract, bilateral: Secondary | ICD-10-CM | POA: Diagnosis not present

## 2017-02-24 DIAGNOSIS — J069 Acute upper respiratory infection, unspecified: Secondary | ICD-10-CM | POA: Diagnosis not present

## 2017-03-16 DIAGNOSIS — M26609 Unspecified temporomandibular joint disorder, unspecified side: Secondary | ICD-10-CM | POA: Diagnosis not present

## 2017-03-16 DIAGNOSIS — Z1231 Encounter for screening mammogram for malignant neoplasm of breast: Secondary | ICD-10-CM | POA: Diagnosis not present

## 2017-03-16 DIAGNOSIS — E2839 Other primary ovarian failure: Secondary | ICD-10-CM | POA: Diagnosis not present

## 2017-03-16 DIAGNOSIS — Z0001 Encounter for general adult medical examination with abnormal findings: Secondary | ICD-10-CM | POA: Diagnosis not present

## 2017-03-16 DIAGNOSIS — Z1389 Encounter for screening for other disorder: Secondary | ICD-10-CM | POA: Diagnosis not present

## 2017-03-16 DIAGNOSIS — M549 Dorsalgia, unspecified: Secondary | ICD-10-CM | POA: Diagnosis not present

## 2017-03-16 DIAGNOSIS — Z1331 Encounter for screening for depression: Secondary | ICD-10-CM | POA: Diagnosis not present

## 2017-03-16 DIAGNOSIS — H6981 Other specified disorders of Eustachian tube, right ear: Secondary | ICD-10-CM | POA: Diagnosis not present

## 2017-03-16 DIAGNOSIS — Z23 Encounter for immunization: Secondary | ICD-10-CM | POA: Diagnosis not present

## 2017-03-22 DIAGNOSIS — M549 Dorsalgia, unspecified: Secondary | ICD-10-CM | POA: Diagnosis not present

## 2017-03-22 DIAGNOSIS — M4804 Spinal stenosis, thoracic region: Secondary | ICD-10-CM | POA: Diagnosis not present

## 2017-03-22 DIAGNOSIS — M47814 Spondylosis without myelopathy or radiculopathy, thoracic region: Secondary | ICD-10-CM | POA: Diagnosis not present

## 2017-03-22 DIAGNOSIS — M4184 Other forms of scoliosis, thoracic region: Secondary | ICD-10-CM | POA: Diagnosis not present

## 2017-03-22 DIAGNOSIS — M5136 Other intervertebral disc degeneration, lumbar region: Secondary | ICD-10-CM | POA: Diagnosis not present

## 2017-04-13 DIAGNOSIS — M549 Dorsalgia, unspecified: Secondary | ICD-10-CM | POA: Diagnosis not present

## 2017-04-13 DIAGNOSIS — M2550 Pain in unspecified joint: Secondary | ICD-10-CM | POA: Diagnosis not present

## 2017-04-13 DIAGNOSIS — R5383 Other fatigue: Secondary | ICD-10-CM | POA: Diagnosis not present

## 2017-04-13 DIAGNOSIS — M791 Myalgia, unspecified site: Secondary | ICD-10-CM | POA: Diagnosis not present

## 2017-04-19 DIAGNOSIS — L820 Inflamed seborrheic keratosis: Secondary | ICD-10-CM | POA: Diagnosis not present

## 2017-04-22 IMAGING — CT CT HEAD W/O CM
3 series · 15 of 46 positions shown, 18 images · non-contrast
Comparison: 06/14/2014

CLINICAL DATA: Headaches for 1 year. History of hydrocephalus.
History of brain surgery.

EXAM:
CT HEAD WITHOUT CONTRAST
TECHNIQUE: Contiguous axial images were obtained from the base of the skull
through the vertex without intravenous contrast.

[Series 2: head w/(date) · axial · 0.44mm/px · z∈[+1044,+1164]mm · 9 of 29 slices shown, 12 images]
[im 3/29  brain]
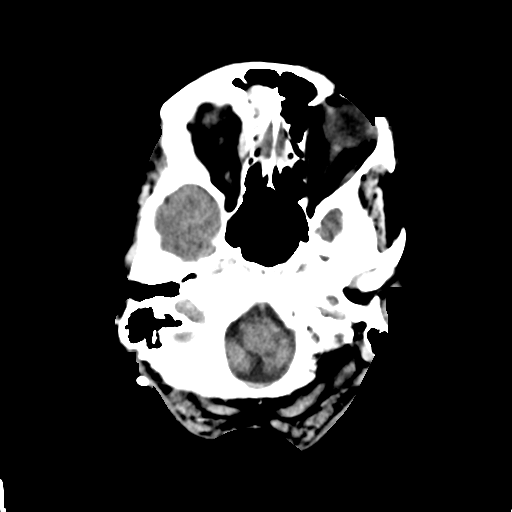
[im 3/29  bone]
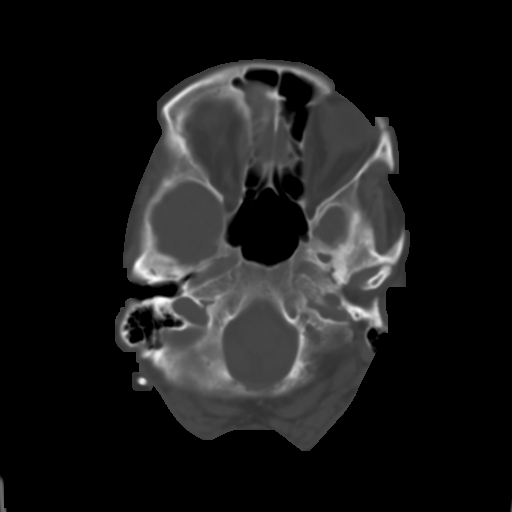
[im 6/29  brain]
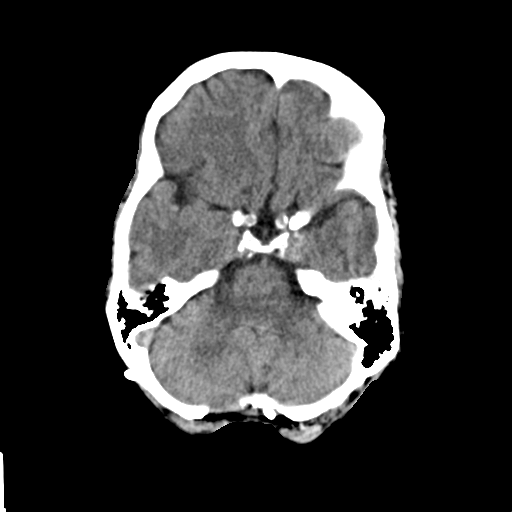
[im 9/29  brain]
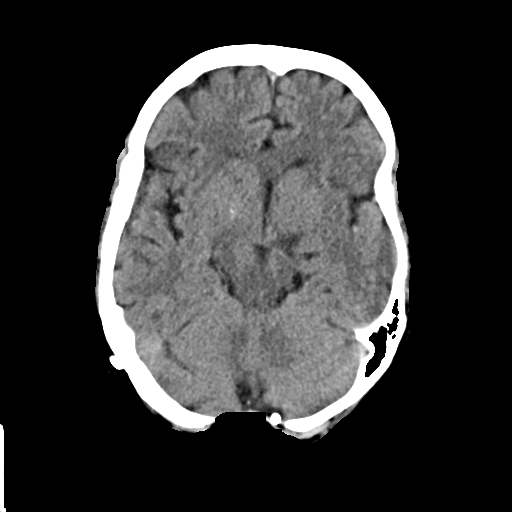
[im 12/29  brain]
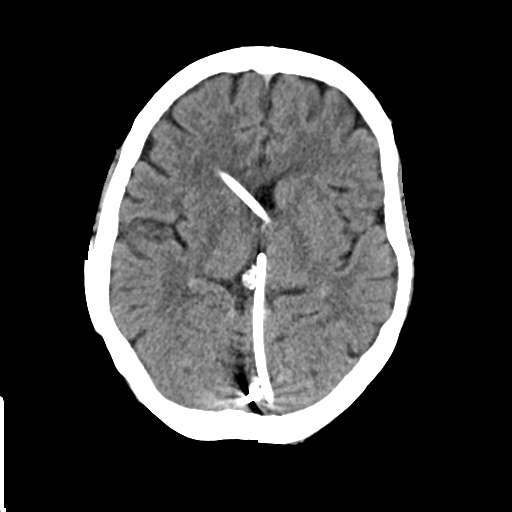
[im 15/29  brain]
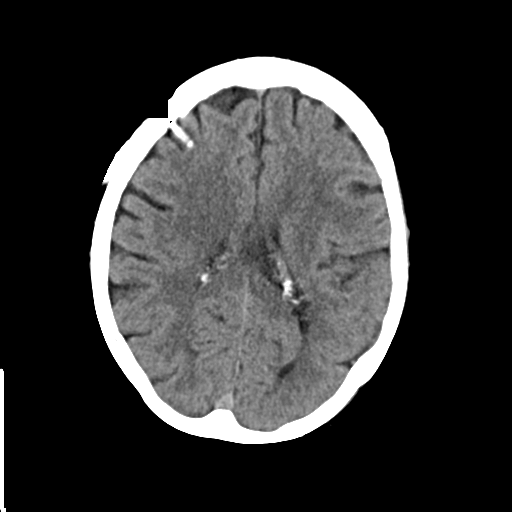
[im 15/29  bone]
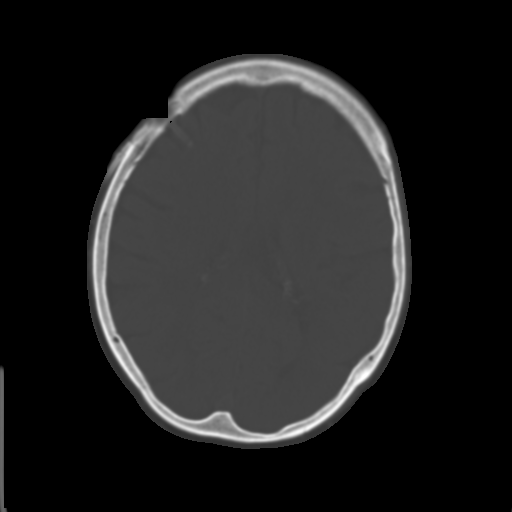
[im 18/29  brain]
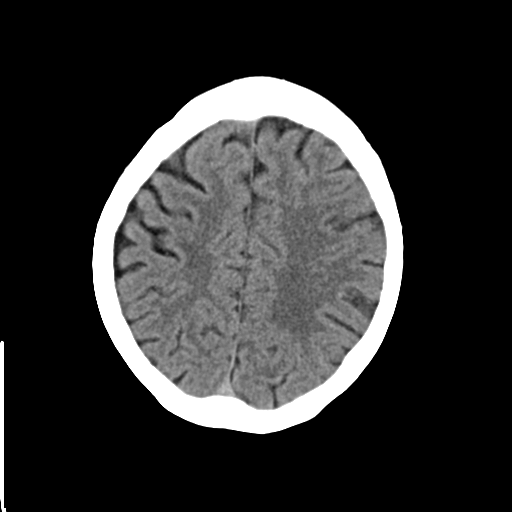
[im 21/29  brain]
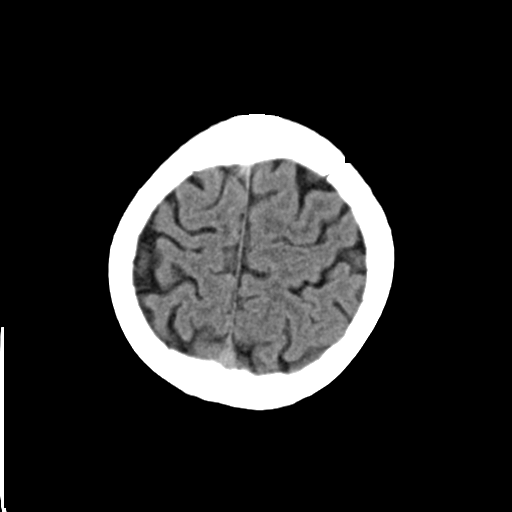
[im 24/29  brain]
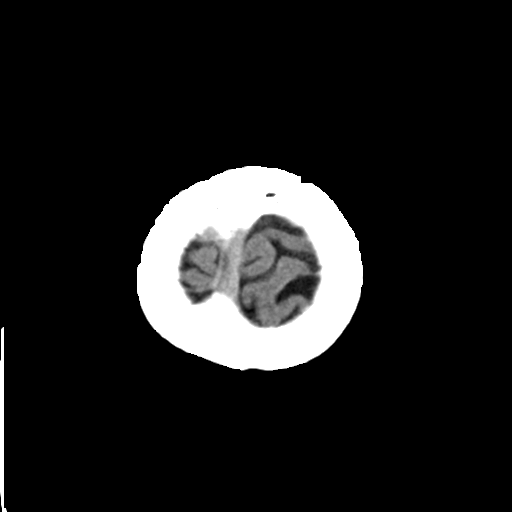
[im 27/29  brain]
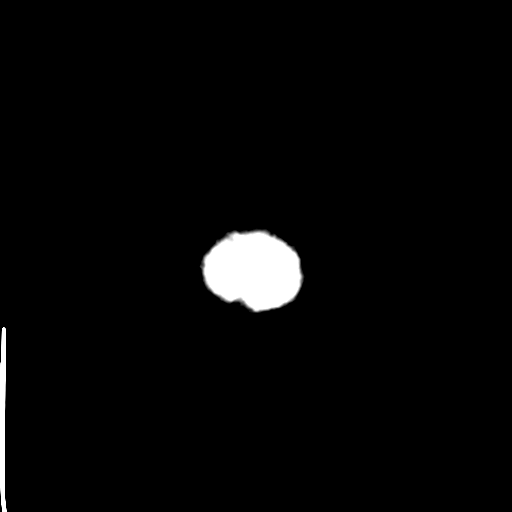
[im 27/29  bone]
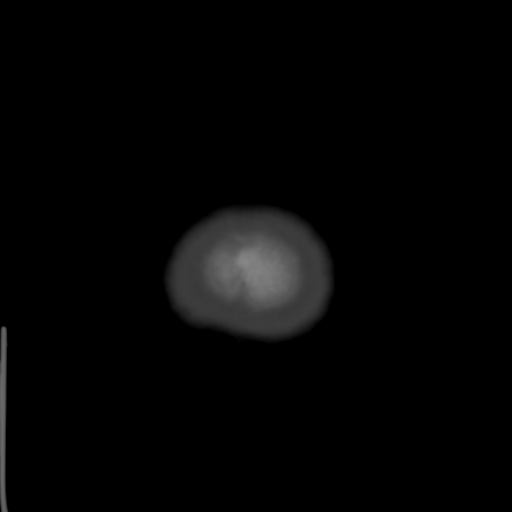

[Series 4: cor · coronal · 0.28mm/px · 3 of 61 slices shown]
[im 21/61  brain]
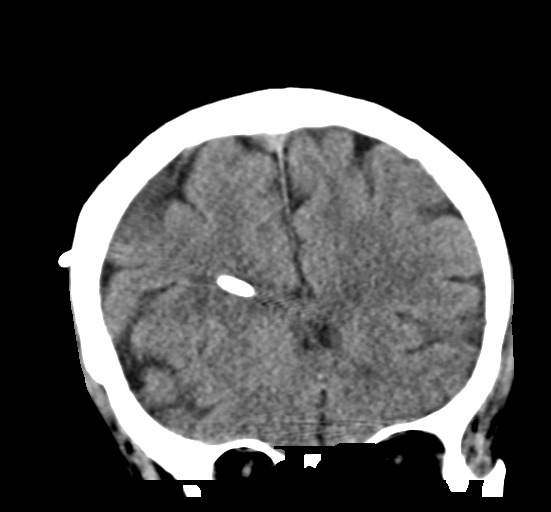
[im 27/61  brain]
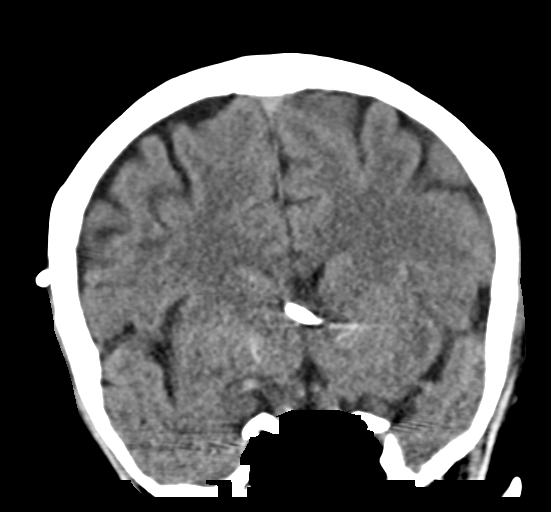
[im 34/61  brain]
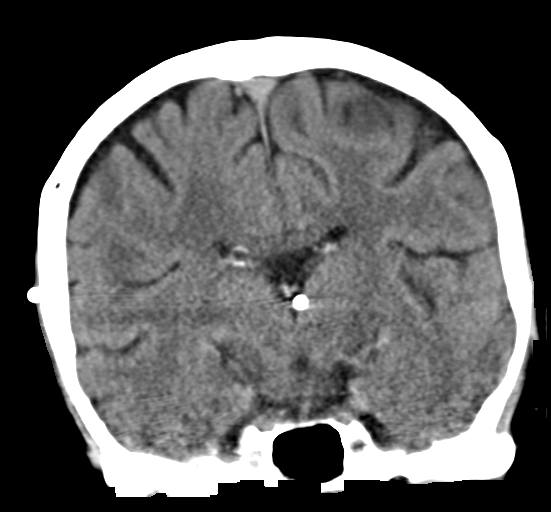

[Series 5: sag · sagittal · 0.27mm/px · 3 of 49 slices shown]
[im 17/49  brain]
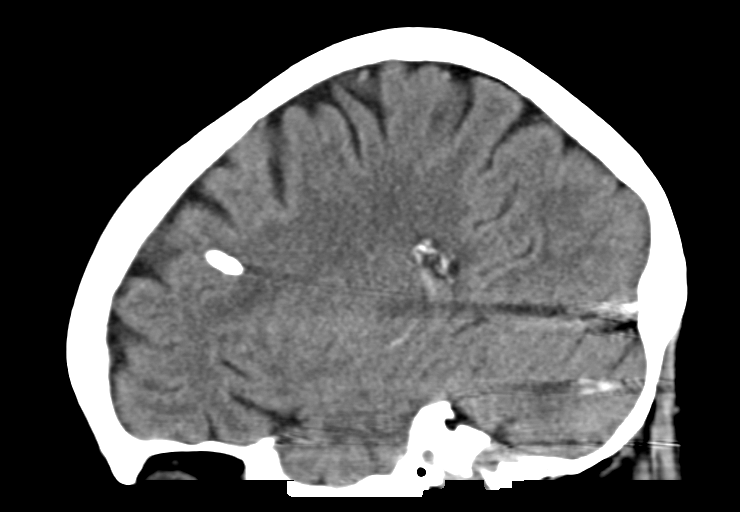
[im 25/49  brain]
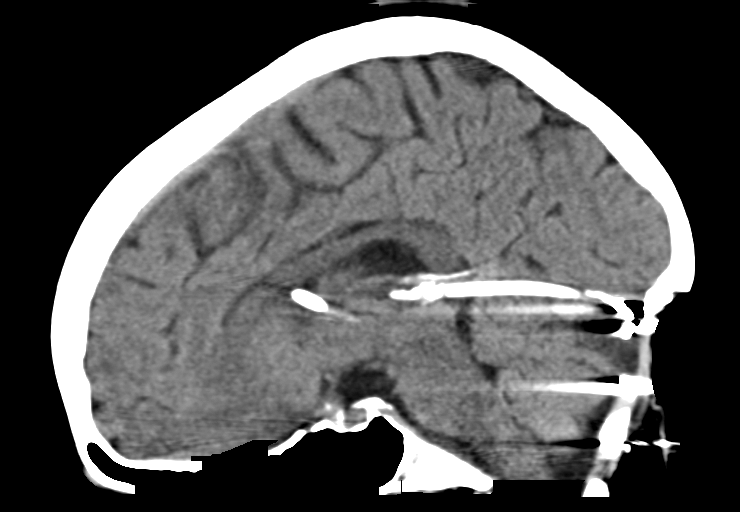
[im 33/49  brain]
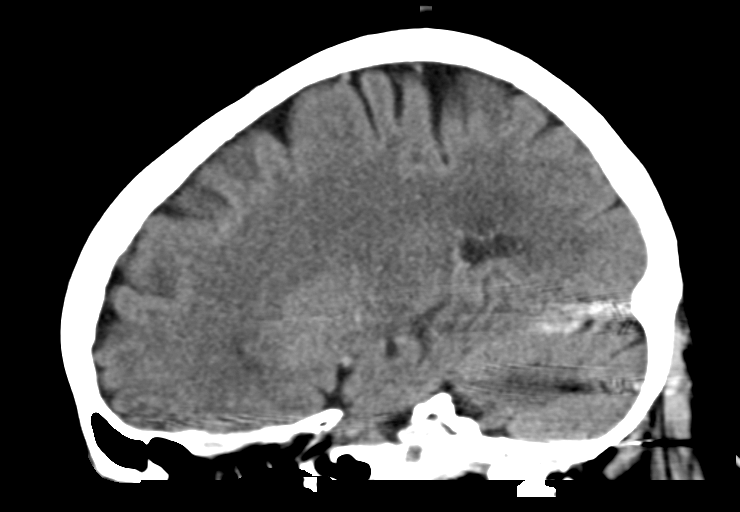

[15 of 46 positions shown; findings below may reference images not displayed]

FINDINGS: Brain: Sequelae of suboccipital craniectomy are again identified
with a posterior approach catheter entering at this location and
terminating near the posterior aspect of the third ventricle. A
right frontal approach catheter terminates just left of midline more
anteriorly, also approximately at the level of the third ventricle.
Both of these are unchanged. The ventricles are decompressed,
unchanged in size and configuration from the prior study. Scattered
cerebral white matter hypodensities are similar to the prior study
and nonspecific but compatible with mild chronic small vessel
ischemic disease. There is no evidence of acute cortical infarct,
intracranial hemorrhage, mass, midline shift, or extra-axial fluid
collection.

Vascular: Mild calcified atherosclerosis at the skullbase. No
hyperdense vessel.

Skull: Craniectomy changes.  No focal osseous lesion.

Sinuses/Orbits: The visualized paranasal sinuses and mastoid air
cells are clear. Visualized orbits are unremarkable.

Other: None.
IMPRESSION: 1. No evidence of acute intracranial abnormality.
2. Unchanged appearance of ventriculostomy catheters and
decompressed ventricles.

## 2017-05-27 NOTE — Progress Notes (Signed)
Office Visit Note  Patient: Glenda Matthews             Date of Birth: [birth year]           MRN: [id number]             PCP: Glenda Kiel, MD Referring: Glenda Kiel, MD Visit Date: 06/01/2017 Occupation: retired    Subjective:  Pain in multiple joints.   History of Present Illness: Glenda Matthews is a 72 y.o. female seen in consultation per request of her PCP.  According to patient she had upper episode of plantar fasciitis in 2014 without any recurrence.  She is been also having lower back pain and bilateral SI joint pain for the last few years.  She has been using a donut cushion.  She recalls that several years ago she fell and the pain has been attributed to that.  She has been having difficulty climbing stairs and getting up from chair due to pelvic pain.  She states she had recent x-rays of her lower back and upper back by her PCP and was told that she had arthritis.  She gives history of some muscle cramps in her lower extremities.  She has some stiffness in her hands and feet but do not have any swelling.  She denies any history of Achilles tendinitis or epicondylitis.  There is no history of psoriasis.  There is positive family history of psoriasis and her granddaughter.   Activities of Daily Living:  Patient reports morning stiffness for 10 minutes.   Patient Denies nocturnal pain.  Difficulty dressing/grooming: Denies Difficulty climbing stairs: Reports Difficulty getting out of chair: Reports Difficulty using hands for taps, buttons, cutlery, and/or writing: Denies   Review of Systems  Constitutional: Positive for fatigue.  HENT: Positive for mouth dryness. Negative for mouth sores and nose dryness.   Eyes: Negative for pain, visual disturbance and dryness.  Respiratory: Negative for cough, hemoptysis, shortness of breath and difficulty breathing.   Cardiovascular: Negative for chest pain, palpitations, hypertension and swelling in legs/feet.    Gastrointestinal: Positive for constipation (Hx of diverticulosis). Negative for blood in stool and diarrhea.  Endocrine: Negative for increased urination.  Genitourinary: Negative for painful urination.  Musculoskeletal: Positive for arthralgias, joint pain and morning stiffness. Negative for joint swelling, myalgias, muscle weakness, muscle tenderness and myalgias.  Skin: Negative for color change, pallor, rash, hair loss, nodules/bumps, skin tightness, ulcers and sensitivity to sunlight.  Allergic/Immunologic: Negative for susceptible to infections.  Neurological: Negative for dizziness, numbness, headaches and weakness.  Hematological: Negative for swollen glands.  Psychiatric/Behavioral: Positive for depressed mood and sleep disturbance. The patient is not nervous/anxious.     PMFS History:  There are no active problems to display for this patient.   Past Medical History:  Diagnosis Date  . Anemia    none in recent years  . Arthritis    shoulders  . Chronic kidney disease    kidney stone  . Headache(784.0)   . Liver cyst   . Sciatica     Family History  Problem Relation Age of Onset  . Cancer Mother    Past Surgical History:  Procedure Laterality Date  . ABDOMINAL HYSTERECTOMY    . BRAIN SURGERY  1998   shunt  . gall bladder    . skin caner    . tubialligation     Social History   Social History Narrative  . Not on file     Objective: Vital Signs: BP Marland Kitchen)  158/90 (BP Location: Left Arm, Patient Position: Sitting, Cuff Size: Normal)   Pulse 86   Ht '5\' 1"'  (1.549 m)   Wt 150 lb (68 kg)   BMI 28.34 kg/m    Physical Exam  Constitutional: She is oriented to person, place, and time. She appears well-developed and well-nourished.  HENT:  Head: Normocephalic and atraumatic.  Eyes: Conjunctivae and EOM are normal.  Neck: Normal range of motion.  Cardiovascular: Normal rate, regular rhythm, normal heart sounds and intact distal pulses.  Pulmonary/Chest: Effort  normal and breath sounds normal.  Abdominal: Soft. Bowel sounds are normal.  Lymphadenopathy:    She has no cervical adenopathy.  Neurological: She is alert and oriented to person, place, and time.  Skin: Skin is warm and dry. Capillary refill takes less than 2 seconds.  Psychiatric: She has a normal mood and affect. Her behavior is normal.  Nursing note and vitals reviewed.    Musculoskeletal Exam: C-spine thoracic lumbar spine good range of motion.  She had no SI joint tenderness.  She had good range of motion of bilateral shoulders, bilateral elbows, bilateral wrist, bilateral MCPs and PIPs.  She has some thickening of bilateral first and fourth DIP joint without synovitis.  She has tenderness over bilateral ischial bursa and bilateral trochanteric bursa.  Hip joints knee joints ankles MTPs PIPs DIPs were in good range of motion.  No synovitis was noted.  CDAI Exam: No CDAI exam completed.    Investigation: Findings:  04/13/17: CBC WNL, CMP WNL, RF 32.9, ANA negative, HLA-B27 negative, iron 94, Uric acid 4.8, CK 46, TSH 1.260,   CBC Latest Ref Rng & Units 01/23/2012  WBC 4.0 - 10.5 K/uL 11.7(H)  Hemoglobin 12.0 - 15.0 g/dL 15.2(H)  Hematocrit 36.0 - 46.0 % 43.9  Platelets 150 - 400 K/uL 233   CMP Latest Ref Rng & Units 01/23/2012  Glucose 70 - 99 mg/dL 137(H)  BUN 6 - 23 mg/dL 13  Creatinine 0.50 - 1.10 mg/dL 0.75  Sodium 135 - 145 mEq/L 138  Potassium 3.5 - 5.1 mEq/L 4.0  Chloride 96 - 112 mEq/L 99  CO2 19 - 32 mEq/L 24  Calcium 8.4 - 10.5 mg/dL 10.2  Total Protein 6.0 - 8.3 g/dL 7.7  Total Bilirubin 0.3 - 1.2 mg/dL 0.6  Alkaline Phos 39 - 117 U/L 106  AST 0 - 37 U/L 14  ALT 0 - 35 U/L 15     Imaging: Xr Foot 2 Views Left  Result Date: 06/01/2017 First MTP, all PIP and DIP narrowing was noted.  No erosive changes were noted.  No intertarsal joint space narrowing was noted.  Calcaneal spur was noted. Impression: These findings are consistent with osteoarthritis of the  foot.  Xr Foot 2 Views Right  Result Date: 06/01/2017 First MTP, all PIP and DIP narrowing was noted.  No erosive changes were noted.  No intertarsal joint space narrowing was noted.  Calcaneal spur was noted. Impression: These findings are consistent with osteoarthritis of the foot.  Xr Hand 2 View Left  Result Date: 06/01/2017 [facility] joint narrowing was noted.   MCP joint narrowing was noted.  PIP and DIP narrowing was noted.  Most prominent in the first and fourth  DIP joints. Impression: These findings are consistent with osteoarthritis and possibly inflammatory arthritis of the hand.  Xr Hand 2 View Right  Result Date: 06/01/2017 [facility] joint narrowing was noted.   MCP joint narrowing was noted.  PIP and DIP narrowing was noted.  Most prominent in the first and fourth  DIP joints. Impression: These findings are consistent with osteoarthritis and possibly inflammatory arthritis of the hand.  Xr Pelvis 1-2 Views  Result Date: 06/01/2017 No SI joint to sclerosis or narrowing was noted. Impression: Unremarkable x-ray of the SI joints.   Speciality Comments: No specialty comments available.    Procedures:  No procedures performed Allergies: Other   Assessment / Plan:     Visit Diagnoses: Rheumatoid factor positive - RF 32.9, ANA negative, HLA-B27 negative,Uric acid 4.8, CK 46, TSH 1.260.  She has positive rheumatoid factor but I do not see any synovitis on examination today.  Pain in both hands - Plan: XR Hand 2 View Right, XR Hand 2 View Left, Cyclic citrul peptide antibody, IgG.  The x-ray of bilateral hands showed MCP PIP and DIP narrowing.  CMC narrowing was noted.  I will schedule ultrasound of bilateral hands to evaluate for inflammatory arthritis. Pain in both feet - Plan: XR Foot 2 Views Right, XR Foot 2 Views Left.  The x-ray of bilateral feet showed only osteoarthritic changes.  Ischial bursitis, bilateral-she has pain over bilateral ischial bursa and has difficulty sitting for  prolonged time.  She has been using a coccyx cushion which is not been helpful..  I will refer her to physical therapy.  A handout on natural anti-inflammatories was given.  Trochanteric bursitis of both hips-she has tenderness over bilateral trochanteric bursa which causes discomfort as well.  Chronic SI joint pain - Plan: XR Pelvis 1-2 Views gives history of SI joint pain but had no point tenderness on palpation today.  The x-ray of SI joints were unremarkable.  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  DDD (degenerative disc disease), lumbar-I do not have x-rays available to review.  TMJ (temporomandibular joint disorder)  Family history of psoriasis - in granddaughter  Hepatic cyst  Renal cyst  Lung nodule  History of kidney stones  History of diverticulosis  Aortic atherosclerosis ([place])    Orders: Orders Placed This Encounter  Procedures  . XR Hand 2 View Right  . XR Hand 2 View Left  . XR Foot 2 Views Right  . XR Foot 2 Views Left  . XR Pelvis 1-2 Views  . Cyclic citrul peptide antibody, IgG  . 14-3-3 eta Protein  . Sedimentation rate  . Iron, TIBC and Ferritin Panel  . Ambulatory referral to Physical Therapy   No orders of the defined types were placed in this encounter.   Face-to-face time spent with patient was 50 minutes. >50% of time was spent in counseling and coordination of care.  Follow-Up Instructions: Return for Osteoarthritis,RF.   Bo Merino, MD  Note - This record has been created using Editor, commissioning.  Chart creation errors have been sought, but may not always  have been located. Such creation errors do not reflect on  the standard of medical care.

## 2017-06-01 ENCOUNTER — Ambulatory Visit (INDEPENDENT_AMBULATORY_CARE_PROVIDER_SITE_OTHER): Payer: Self-pay

## 2017-06-01 ENCOUNTER — Ambulatory Visit (INDEPENDENT_AMBULATORY_CARE_PROVIDER_SITE_OTHER): Payer: Medicare Other

## 2017-06-01 ENCOUNTER — Encounter (INDEPENDENT_AMBULATORY_CARE_PROVIDER_SITE_OTHER): Payer: Self-pay

## 2017-06-01 ENCOUNTER — Ambulatory Visit (INDEPENDENT_AMBULATORY_CARE_PROVIDER_SITE_OTHER): Payer: Medicare Other | Admitting: Rheumatology

## 2017-06-01 ENCOUNTER — Encounter: Payer: Self-pay | Admitting: Rheumatology

## 2017-06-01 VITALS — BP 158/90 | HR 86 | Ht 61.0 in | Wt 150.0 lb

## 2017-06-01 DIAGNOSIS — M79672 Pain in left foot: Secondary | ICD-10-CM

## 2017-06-01 DIAGNOSIS — M533 Sacrococcygeal disorders, not elsewhere classified: Secondary | ICD-10-CM | POA: Diagnosis not present

## 2017-06-01 DIAGNOSIS — R768 Other specified abnormal immunological findings in serum: Secondary | ICD-10-CM | POA: Diagnosis not present

## 2017-06-01 DIAGNOSIS — M5134 Other intervertebral disc degeneration, thoracic region: Secondary | ICD-10-CM

## 2017-06-01 DIAGNOSIS — G8929 Other chronic pain: Secondary | ICD-10-CM

## 2017-06-01 DIAGNOSIS — M79641 Pain in right hand: Secondary | ICD-10-CM

## 2017-06-01 DIAGNOSIS — R7689 Other specified abnormal immunological findings in serum: Secondary | ICD-10-CM

## 2017-06-01 DIAGNOSIS — K7689 Other specified diseases of liver: Secondary | ICD-10-CM | POA: Diagnosis not present

## 2017-06-01 DIAGNOSIS — R911 Solitary pulmonary nodule: Secondary | ICD-10-CM

## 2017-06-01 DIAGNOSIS — M79642 Pain in left hand: Secondary | ICD-10-CM

## 2017-06-01 DIAGNOSIS — I700 Atherosclerosis of aorta: Secondary | ICD-10-CM

## 2017-06-01 DIAGNOSIS — M7070 Other bursitis of hip, unspecified hip: Secondary | ICD-10-CM | POA: Diagnosis not present

## 2017-06-01 DIAGNOSIS — Z840 Family history of diseases of the skin and subcutaneous tissue: Secondary | ICD-10-CM

## 2017-06-01 DIAGNOSIS — N281 Cyst of kidney, acquired: Secondary | ICD-10-CM | POA: Diagnosis not present

## 2017-06-01 DIAGNOSIS — M5136 Other intervertebral disc degeneration, lumbar region: Secondary | ICD-10-CM | POA: Diagnosis not present

## 2017-06-01 DIAGNOSIS — M79671 Pain in right foot: Secondary | ICD-10-CM | POA: Diagnosis not present

## 2017-06-01 DIAGNOSIS — M51369 Other intervertebral disc degeneration, lumbar region without mention of lumbar back pain or lower extremity pain: Secondary | ICD-10-CM

## 2017-06-01 DIAGNOSIS — Z87442 Personal history of urinary calculi: Secondary | ICD-10-CM

## 2017-06-01 DIAGNOSIS — M7062 Trochanteric bursitis, left hip: Secondary | ICD-10-CM

## 2017-06-01 DIAGNOSIS — M7061 Trochanteric bursitis, right hip: Secondary | ICD-10-CM

## 2017-06-01 DIAGNOSIS — M26609 Unspecified temporomandibular joint disorder, unspecified side: Secondary | ICD-10-CM | POA: Diagnosis not present

## 2017-06-01 DIAGNOSIS — Z8719 Personal history of other diseases of the digestive system: Secondary | ICD-10-CM

## 2017-06-01 NOTE — Patient Instructions (Addendum)
Natural anti-inflammatories  You can purchase these at [employer], [employer] or online.  . Turmeric (capsules)  . Ginger (ginger root or capsules)  . Omega 3 (Fish, flax seeds, chia seeds, walnuts, almonds)  . Tart cherry (dried or extract)   Patient should be under the care of a physician while taking these supplements. This may not be reproduced without the permission of Dr. Birdena Kingma.  Iliotibial Band Syndrome Rehab Ask your health care provider which exercises are safe for you. Do exercises exactly as told by your health care provider and adjust them as directed. It is normal to feel mild stretching, pulling, tightness, or discomfort as you do these exercises, but you should stop right away if you feel sudden pain or your pain gets worse.Do not begin these exercises until told by your health care provider. Stretching and range of motion exercises These exercises warm up your muscles and joints and improve the movement and flexibility of your hip and pelvis. Exercise A: Quadriceps, prone  1. Lie on your abdomen on a firm surface, such as a bed or padded floor. 2. Bend your left / right knee and hold your ankle. If you cannot reach your ankle or pant leg, loop a belt around your foot and grab the belt instead. 3. Gently pull your heel toward your buttocks. Your knee should not slide out to the side. You should feel a stretch in the front of your thigh and knee. 4. Hold this position for __________ seconds. Repeat __________ times. Complete this stretch __________ times a day. Exercise B: Iliotibial band  1. Lie on your side with your left / right leg in the top position. 2. Bend both of your knees and grab your left / right ankle. Stretch out your bottom arm to help you balance. 3. Slowly bring your top knee back so your thigh goes behind your trunk. 4. Slowly lower your top leg toward the floor until you feel a gentle stretch on the outside of your left / right hip and  thigh. If you do not feel a stretch and your knee will not fall farther, place the heel of your other foot on top of your knee and pull your knee down toward the floor with your foot. 5. Hold this position for __________ seconds. Repeat __________ times. Complete this stretch __________ times a day. Strengthening exercises These exercises build strength and endurance in your hip and pelvis. Endurance is the ability to use your muscles for a long time, even after they get tired. Exercise C: Straight leg raises ( hip abductors) 1. Lie on your side with your left / right leg in the top position. Lie so your head, shoulder, knee, and hip line up. You may bend your bottom knee to help you balance. 2. Roll your hips slightly forward so your hips are stacked directly over each other and your left / right knee is facing forward. 3. Tense the muscles in your outer thigh and lift your top leg 4-6 inches (10-15 cm). 4. Hold this position for __________ seconds. 5. Slowly return to the starting position. Let your muscles relax completely before doing another repetition. Repeat __________ times. Complete this exercise __________ times a day. Exercise D: Straight leg raises ( hip extensors) 1. Lie on your abdomen on your bed or a firm surface. You can put a pillow under your hips if that is more comfortable. 2. Bend your left / right knee so your foot is straight up in the air.   3. Squeeze your buttock muscles and lift your left / right thigh off the bed. Do not let your back arch. 4. Tense this muscle as hard as you can without increasing any knee pain. 5. Hold this position for __________ seconds. 6. Slowly lower your leg to the starting position and allow it to relax completely. Repeat __________ times. Complete this exercise __________ times a day. Exercise E: Hip hike 1. Stand sideways on a bottom step. Stand on your left / right leg with your other foot unsupported next to the step. You can hold onto  the railing or wall if needed for balance. 2. Keep your knees straight and your torso square. Then, lift your left / right hip up toward the ceiling. 3. Slowly let your left / right hip lower toward the floor, past the starting position. Your foot should get closer to the floor. Do not lean or bend your knees. Repeat __________ times. Complete this exercise __________ times a day. This information is not intended to replace advice given to you by your health care provider. Make sure you discuss any questions you have with your health care provider. Document Released: 12/22/2004 Document Revised: 08/27/2015 Document Reviewed: 11/23/2014 Elsevier Interactive Patient Education  2018 [employer]  

## 2017-06-04 NOTE — Progress Notes (Signed)
Ultrasound examination of bilateral hands was performed per EULAR recommendations. Using 12 MHz transducer, grayscale and power Doppler bilateral second, third, and fifth MCP joints and bilateral wrist joints both dorsal and volar aspects were evaluated to look for synovitis or tenosynovitis. The findings were there was no synovitis or tenosynovitis on ultrasound examination. Right median nerve was 0.07 cm squares bifid which was within normal limits and left median nerve was 0.06 cm squares which was within normal limits.  Impression: Ultrasound examination did not show any synovitis.  Bilateral median nerves are within normal limits. Bo Merino, MD

## 2017-06-05 LAB — IRON,TIBC AND FERRITIN PANEL
%SAT: 20 % (calc) (ref 11–50)
Ferritin: 57 ng/mL (ref 20–288)
Iron: 80 ug/dL (ref 45–160)
TIBC: 399 mcg/dL (calc) (ref 250–450)

## 2017-06-05 LAB — 14-3-3 ETA PROTEIN: 14-3-3 eta Protein: <0.2 ng/mL (ref <0.2)

## 2017-06-05 LAB — SEDIMENTATION RATE: Sed Rate: 2 mm/h (ref 0–30)

## 2017-06-05 LAB — CYCLIC CITRUL PEPTIDE ANTIBODY, IGG: Cyclic Citrullin Peptide Ab: <16 UNITS

## 2017-06-09 ENCOUNTER — Ambulatory Visit (INDEPENDENT_AMBULATORY_CARE_PROVIDER_SITE_OTHER): Payer: Medicare Other | Admitting: Rheumatology

## 2017-06-09 ENCOUNTER — Ambulatory Visit (INDEPENDENT_AMBULATORY_CARE_PROVIDER_SITE_OTHER): Payer: Self-pay

## 2017-06-09 DIAGNOSIS — M79642 Pain in left hand: Secondary | ICD-10-CM

## 2017-06-09 DIAGNOSIS — M79641 Pain in right hand: Secondary | ICD-10-CM

## 2017-06-25 DIAGNOSIS — M62838 Other muscle spasm: Secondary | ICD-10-CM | POA: Diagnosis not present

## 2017-06-25 DIAGNOSIS — R3915 Urgency of urination: Secondary | ICD-10-CM | POA: Diagnosis not present

## 2017-06-25 DIAGNOSIS — M7062 Trochanteric bursitis, left hip: Secondary | ICD-10-CM | POA: Diagnosis not present

## 2017-06-25 DIAGNOSIS — M7061 Trochanteric bursitis, right hip: Secondary | ICD-10-CM | POA: Diagnosis not present

## 2017-06-25 DIAGNOSIS — M6281 Muscle weakness (generalized): Secondary | ICD-10-CM | POA: Diagnosis not present

## 2017-06-29 ENCOUNTER — Ambulatory Visit: Payer: Self-pay | Admitting: Rheumatology

## 2017-07-05 DIAGNOSIS — M7061 Trochanteric bursitis, right hip: Secondary | ICD-10-CM | POA: Diagnosis not present

## 2017-07-05 DIAGNOSIS — M7062 Trochanteric bursitis, left hip: Secondary | ICD-10-CM | POA: Diagnosis not present

## 2017-07-05 DIAGNOSIS — M6281 Muscle weakness (generalized): Secondary | ICD-10-CM | POA: Diagnosis not present

## 2017-07-05 DIAGNOSIS — M62838 Other muscle spasm: Secondary | ICD-10-CM | POA: Diagnosis not present

## 2017-07-05 DIAGNOSIS — R3915 Urgency of urination: Secondary | ICD-10-CM | POA: Diagnosis not present

## 2017-07-05 NOTE — Progress Notes (Signed)
Office Visit Note  Patient: Glenda Matthews             Date of Birth: Sep 29, 1945           MRN: [id number]             PCP: Ernestene Kiel, MD Referring: Ernestene Kiel, MD Visit Date: 07/13/2017 Occupation: @GUAROCC @    Subjective:  Pain in multiple joints and muscles.  History of Present Illness: [name] is a 72 y.o. female with history of osteoarthritis and DDD.  She states she has been having some pain and stiffness in her bilateral hands.  She went for physical therapy due to ischial bursitis and trochanteric bursitis.  She states that history of bursitis is better but she is been having discomfort in the SI joint on the right side.  She has intermittent upper back pain and lower back pain persist.  Activities of Daily Living:  Patient reports morning stiffness for a few minutes.   Patient Reports nocturnal pain.  Difficulty dressing/grooming: Denies Difficulty climbing stairs: Reports Difficulty getting out of chair: Reports Difficulty using hands for taps, buttons, cutlery, and/or writing: Reports   Review of Systems  Constitutional: Positive for fatigue.  HENT: Positive for mouth dryness. Negative for mouth sores and nose dryness.   Eyes: Negative for pain, visual disturbance and dryness.  Respiratory: Negative for cough, hemoptysis, shortness of breath and difficulty breathing.   Cardiovascular: Negative for chest pain, palpitations, hypertension and swelling in legs/feet.  Gastrointestinal: Negative for blood in stool, constipation and diarrhea.  Endocrine: Negative for increased urination.  Genitourinary: Negative for painful urination and pelvic pain.  Musculoskeletal: Positive for joint swelling, myalgias, morning stiffness and myalgias. Negative for arthralgias, joint pain, muscle weakness and muscle tenderness.  Skin: Negative for color change, pallor, rash, hair loss, nodules/bumps, skin tightness, ulcers and sensitivity to sunlight.    Allergic/Immunologic: Negative for susceptible to infections.  Neurological: Negative for dizziness, light-headedness, numbness, headaches and weakness.  Hematological: Negative for swollen glands.  Psychiatric/Behavioral: Negative for depressed mood and sleep disturbance. The patient is not nervous/anxious.     PMFS History:  There are no active problems to display for this patient.   Past Medical History:  Diagnosis Date  . Anemia    none in recent years  . Arthritis    shoulders  . Chronic kidney disease    kidney stone  . Headache(784.0)   . Liver cyst   . Sciatica     Family History  Problem Relation Age of Onset  . Cancer Mother    Past Surgical History:  Procedure Laterality Date  . ABDOMINAL HYSTERECTOMY    . BRAIN SURGERY  1998   shunt  . gall bladder    . skin caner    . tubialligation     Social History   Social History Narrative  . Not on file     Objective: Vital Signs: BP (!) 146/88 (BP Location: Left Arm, Patient Position: Sitting, Cuff Size: Normal)   Pulse 94   Resp 12   Ht 5\' 1"  (1.549 m)   Wt 153 lb (69.4 kg)   BMI 28.91 kg/m    Physical Exam  Constitutional: She is oriented to person, place, and time. She appears well-developed and well-nourished.  HENT:  Head: Normocephalic and atraumatic.  Eyes: Conjunctivae and EOM are normal.  Neck: Normal range of motion.  Cardiovascular: Normal rate, regular rhythm, normal heart sounds and intact distal pulses.  Pulmonary/Chest: Effort normal  and breath sounds normal.  Abdominal: Soft. Bowel sounds are normal.  Lymphadenopathy:    She has no cervical adenopathy.  Neurological: She is alert and oriented to person, place, and time.  Skin: Skin is warm and dry. Capillary refill takes less than 2 seconds.  Psychiatric: She has a normal mood and affect. Her behavior is normal.  Nursing note and vitals reviewed.    Musculoskeletal Exam: C-spine thoracic lumbar spine good range of motion.  She  had no SI joint tenderness.  Shoulder joints elbow joints were in good range of motion.  She had DIP PIP thickening in her hands and feet consistent with osteoarthritis.  She has tenderness over bilateral trochanteric and ischial bursa.  No synovitis was noted.  CDAI Exam: No CDAI exam completed.    Investigation: Findings:  RF 32.9, CCP negative, 14 3 3  adequate negative sed rate 2, iron 80, TIBC 399, %sat 20, ferritin 57  Component     Latest Ref Rng & Units 06/01/2017  Iron     45 - 160 mcg/dL 80  TIBC     250 - 450 mcg/dL (calc) 399  %SAT     11 - 50 % (calc) 20  Ferritin     20 - 288 ng/mL 57  14-3-3 eta Protein     <0.2 ng/mL <0.2  Sed Rate     0 - 30 mm/h 2  Cyclic Citrullin Peptide Ab     UNITS <16   CBC Latest Ref Rng & Units 01/23/2012  WBC 4.0 - 10.5 K/uL 11.7(H)  Hemoglobin 12.0 - 15.0 g/dL 15.2(H)  Hematocrit 36.0 - 46.0 % 43.9  Platelets 150 - 400 K/uL 233   CMP Latest Ref Rng & Units 01/23/2012  Glucose 70 - 99 mg/dL 137(H)  BUN 6 - 23 mg/dL 13  Creatinine 0.50 - 1.10 mg/dL 0.75  Sodium 135 - 145 mEq/L 138  Potassium 3.5 - 5.1 mEq/L 4.0  Chloride 96 - 112 mEq/L 99  CO2 19 - 32 mEq/L 24  Calcium 8.4 - 10.5 mg/dL 10.2  Total Protein 6.0 - 8.3 g/dL 7.7  Total Bilirubin 0.3 - 1.2 mg/dL 0.6  Alkaline Phos 39 - 117 U/L 106  AST 0 - 37 U/L 14  ALT 0 - 35 U/L 15     Imaging: No results found.  Speciality Comments: No specialty comments available.    Procedures:  No procedures performed Allergies: Other   Assessment / Plan:     Visit Diagnoses: Primary osteoarthritis of both hands - U/s of bilateral hands 06/09/2017 negative synovitis in bilateral median nerves WNLXR:PIP, DIP,CMC, and MCP joint narrowing, no erosions RF+, CCP-, 14-3-3 neg.  Patient has no synovitis on examination.  Joint protection muscle strengthening was discussed.  If she develops any swelling in future she should notify us.  Natural anti-inflammatory's were discussed.  Primary  osteoarthritis of both feet-proper fitting shoes were discussed.  Ischial bursitis, unspecified laterality - Coccyx cushion did not help.  She has been going to physical therapy which has been helpful.  Trochanteric bursitis of both hips-she has noticed improvement with physical therapy.  Chronic SI joint pain - XR unremarkable .  She continues to have some left SI joint discomfort.  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  Weight loss diet and exercise was discussed.  Family history of psoriasis - Grandfather  Other medical problems are listed as follows:  Hepatic cyst  Renal cyst  History of kidney stones  Lung nodule  History of diverticulosis  Aortic atherosclerosis ([place])    Orders: No orders of the defined types were placed in this encounter.  No orders of the defined types were placed in this encounter.   Face-to-face time spent with patient was 30 minutes. Greater than 50% of time was spent in counseling and coordination of care.  Follow-Up Instructions: Return in about 6 months (around 01/13/2018) for Osteoarthritis, DDD.   Bo Merino, MD  Note - This record has been created using Editor, commissioning.  Chart creation errors have been sought, but may not always  have been located. Such creation errors do not reflect on  the standard of medical care.

## 2017-07-12 DIAGNOSIS — M7061 Trochanteric bursitis, right hip: Secondary | ICD-10-CM | POA: Diagnosis not present

## 2017-07-12 DIAGNOSIS — M62838 Other muscle spasm: Secondary | ICD-10-CM | POA: Diagnosis not present

## 2017-07-12 DIAGNOSIS — M6281 Muscle weakness (generalized): Secondary | ICD-10-CM | POA: Diagnosis not present

## 2017-07-12 DIAGNOSIS — R3915 Urgency of urination: Secondary | ICD-10-CM | POA: Diagnosis not present

## 2017-07-12 DIAGNOSIS — M7062 Trochanteric bursitis, left hip: Secondary | ICD-10-CM | POA: Diagnosis not present

## 2017-07-13 ENCOUNTER — Encounter: Payer: Self-pay | Admitting: Rheumatology

## 2017-07-13 ENCOUNTER — Ambulatory Visit (INDEPENDENT_AMBULATORY_CARE_PROVIDER_SITE_OTHER): Payer: Medicare Other | Admitting: Rheumatology

## 2017-07-13 VITALS — BP 146/88 | HR 94 | Resp 12 | Ht 61.0 in | Wt 153.0 lb

## 2017-07-13 DIAGNOSIS — M19042 Primary osteoarthritis, left hand: Secondary | ICD-10-CM | POA: Diagnosis not present

## 2017-07-13 DIAGNOSIS — N281 Cyst of kidney, acquired: Secondary | ICD-10-CM

## 2017-07-13 DIAGNOSIS — R911 Solitary pulmonary nodule: Secondary | ICD-10-CM

## 2017-07-13 DIAGNOSIS — M7062 Trochanteric bursitis, left hip: Secondary | ICD-10-CM

## 2017-07-13 DIAGNOSIS — K7689 Other specified diseases of liver: Secondary | ICD-10-CM | POA: Diagnosis not present

## 2017-07-13 DIAGNOSIS — Z87442 Personal history of urinary calculi: Secondary | ICD-10-CM

## 2017-07-13 DIAGNOSIS — M19071 Primary osteoarthritis, right ankle and foot: Secondary | ICD-10-CM

## 2017-07-13 DIAGNOSIS — M19072 Primary osteoarthritis, left ankle and foot: Secondary | ICD-10-CM

## 2017-07-13 DIAGNOSIS — M5136 Other intervertebral disc degeneration, lumbar region: Secondary | ICD-10-CM | POA: Diagnosis not present

## 2017-07-13 DIAGNOSIS — M7061 Trochanteric bursitis, right hip: Secondary | ICD-10-CM

## 2017-07-13 DIAGNOSIS — M7070 Other bursitis of hip, unspecified hip: Secondary | ICD-10-CM | POA: Diagnosis not present

## 2017-07-13 DIAGNOSIS — Z840 Family history of diseases of the skin and subcutaneous tissue: Secondary | ICD-10-CM | POA: Diagnosis not present

## 2017-07-13 DIAGNOSIS — Z8719 Personal history of other diseases of the digestive system: Secondary | ICD-10-CM

## 2017-07-13 DIAGNOSIS — M5134 Other intervertebral disc degeneration, thoracic region: Secondary | ICD-10-CM | POA: Diagnosis not present

## 2017-07-13 DIAGNOSIS — M533 Sacrococcygeal disorders, not elsewhere classified: Secondary | ICD-10-CM

## 2017-07-13 DIAGNOSIS — M19041 Primary osteoarthritis, right hand: Secondary | ICD-10-CM

## 2017-07-13 DIAGNOSIS — I700 Atherosclerosis of aorta: Secondary | ICD-10-CM

## 2017-07-13 DIAGNOSIS — G8929 Other chronic pain: Secondary | ICD-10-CM

## 2017-07-20 DIAGNOSIS — M7062 Trochanteric bursitis, left hip: Secondary | ICD-10-CM | POA: Diagnosis not present

## 2017-07-20 DIAGNOSIS — R3915 Urgency of urination: Secondary | ICD-10-CM | POA: Diagnosis not present

## 2017-07-20 DIAGNOSIS — M6289 Other specified disorders of muscle: Secondary | ICD-10-CM | POA: Diagnosis not present

## 2017-07-20 DIAGNOSIS — R102 Pelvic and perineal pain: Secondary | ICD-10-CM | POA: Diagnosis not present

## 2017-07-20 DIAGNOSIS — M6281 Muscle weakness (generalized): Secondary | ICD-10-CM | POA: Diagnosis not present

## 2017-07-20 DIAGNOSIS — M62838 Other muscle spasm: Secondary | ICD-10-CM | POA: Diagnosis not present

## 2017-08-03 DIAGNOSIS — M7062 Trochanteric bursitis, left hip: Secondary | ICD-10-CM | POA: Diagnosis not present

## 2017-08-03 DIAGNOSIS — M62838 Other muscle spasm: Secondary | ICD-10-CM | POA: Diagnosis not present

## 2017-08-03 DIAGNOSIS — M6281 Muscle weakness (generalized): Secondary | ICD-10-CM | POA: Diagnosis not present

## 2017-08-03 DIAGNOSIS — R102 Pelvic and perineal pain: Secondary | ICD-10-CM | POA: Diagnosis not present

## 2017-08-03 DIAGNOSIS — M6289 Other specified disorders of muscle: Secondary | ICD-10-CM | POA: Diagnosis not present

## 2017-08-03 DIAGNOSIS — M7061 Trochanteric bursitis, right hip: Secondary | ICD-10-CM | POA: Diagnosis not present

## 2017-08-13 DIAGNOSIS — M7062 Trochanteric bursitis, left hip: Secondary | ICD-10-CM | POA: Diagnosis not present

## 2017-08-13 DIAGNOSIS — M6281 Muscle weakness (generalized): Secondary | ICD-10-CM | POA: Diagnosis not present

## 2017-08-13 DIAGNOSIS — R102 Pelvic and perineal pain: Secondary | ICD-10-CM | POA: Diagnosis not present

## 2017-08-13 DIAGNOSIS — M6289 Other specified disorders of muscle: Secondary | ICD-10-CM | POA: Diagnosis not present

## 2017-08-13 DIAGNOSIS — R3915 Urgency of urination: Secondary | ICD-10-CM | POA: Diagnosis not present

## 2017-08-13 DIAGNOSIS — M7061 Trochanteric bursitis, right hip: Secondary | ICD-10-CM | POA: Diagnosis not present

## 2017-08-13 DIAGNOSIS — M62838 Other muscle spasm: Secondary | ICD-10-CM | POA: Diagnosis not present

## 2017-08-20 DIAGNOSIS — M6281 Muscle weakness (generalized): Secondary | ICD-10-CM | POA: Diagnosis not present

## 2017-08-20 DIAGNOSIS — M7061 Trochanteric bursitis, right hip: Secondary | ICD-10-CM | POA: Diagnosis not present

## 2017-08-20 DIAGNOSIS — R102 Pelvic and perineal pain: Secondary | ICD-10-CM | POA: Diagnosis not present

## 2017-08-20 DIAGNOSIS — M7062 Trochanteric bursitis, left hip: Secondary | ICD-10-CM | POA: Diagnosis not present

## 2017-08-20 DIAGNOSIS — R3915 Urgency of urination: Secondary | ICD-10-CM | POA: Diagnosis not present

## 2017-08-20 DIAGNOSIS — M6289 Other specified disorders of muscle: Secondary | ICD-10-CM | POA: Diagnosis not present

## 2017-08-20 DIAGNOSIS — M62838 Other muscle spasm: Secondary | ICD-10-CM | POA: Diagnosis not present

## 2017-08-31 DIAGNOSIS — M6289 Other specified disorders of muscle: Secondary | ICD-10-CM | POA: Diagnosis not present

## 2017-08-31 DIAGNOSIS — M6281 Muscle weakness (generalized): Secondary | ICD-10-CM | POA: Diagnosis not present

## 2017-08-31 DIAGNOSIS — M7061 Trochanteric bursitis, right hip: Secondary | ICD-10-CM | POA: Diagnosis not present

## 2017-08-31 DIAGNOSIS — M7062 Trochanteric bursitis, left hip: Secondary | ICD-10-CM | POA: Diagnosis not present

## 2017-08-31 DIAGNOSIS — M62838 Other muscle spasm: Secondary | ICD-10-CM | POA: Diagnosis not present

## 2017-09-20 DIAGNOSIS — M7061 Trochanteric bursitis, right hip: Secondary | ICD-10-CM | POA: Diagnosis not present

## 2017-09-20 DIAGNOSIS — M6281 Muscle weakness (generalized): Secondary | ICD-10-CM | POA: Diagnosis not present

## 2017-09-20 DIAGNOSIS — M62838 Other muscle spasm: Secondary | ICD-10-CM | POA: Diagnosis not present

## 2017-09-20 DIAGNOSIS — M6289 Other specified disorders of muscle: Secondary | ICD-10-CM | POA: Diagnosis not present

## 2017-09-20 DIAGNOSIS — R3915 Urgency of urination: Secondary | ICD-10-CM | POA: Diagnosis not present

## 2017-09-20 DIAGNOSIS — M7062 Trochanteric bursitis, left hip: Secondary | ICD-10-CM | POA: Diagnosis not present

## 2017-12-05 HISTORY — PX: BASAL CELL CARCINOMA EXCISION: SHX1214

## 2017-12-27 DIAGNOSIS — D485 Neoplasm of uncertain behavior of skin: Secondary | ICD-10-CM | POA: Diagnosis not present

## 2017-12-27 DIAGNOSIS — L439 Lichen planus, unspecified: Secondary | ICD-10-CM | POA: Diagnosis not present

## 2017-12-30 NOTE — Progress Notes (Deleted)
   Office Visit Note  Patient: Glenda Matthews             Date of Birth: 1945/01/29           MRN: [id number]             PCP: Ernestene Kiel, MD Referring: Ernestene Kiel, MD Visit Date: 01/11/2018 Occupation: @GUAROCC @  Subjective:  No chief complaint on file.   History of Present Illness: [name] is a 72 y.o. female ***   Activities of Daily Living:  Patient reports morning stiffness for *** {minute/hour:19697}.   Patient {ACTIONS;DENIES/REPORTS:21021675::"Denies"} nocturnal pain.  Difficulty dressing/grooming: {ACTIONS;DENIES/REPORTS:21021675::"Denies"} Difficulty climbing stairs: {ACTIONS;DENIES/REPORTS:21021675::"Denies"} Difficulty getting out of chair: {ACTIONS;DENIES/REPORTS:21021675::"Denies"} Difficulty using hands for taps, buttons, cutlery, and/or writing: {ACTIONS;DENIES/REPORTS:21021675::"Denies"}  No Rheumatology ROS completed.   PMFS History:  There are no active problems to display for this patient.   Past Medical History:  Diagnosis Date  . Anemia    none in recent years  . Arthritis    shoulders  . Chronic kidney disease    kidney stone  . Headache(784.0)   . Liver cyst   . Sciatica     Family History  Problem Relation Age of Onset  . Cancer Mother    Past Surgical History:  Procedure Laterality Date  . ABDOMINAL HYSTERECTOMY    . BRAIN SURGERY  1998   shunt  . gall bladder    . skin caner    . tubialligation     Social History   Social History Narrative  . Not on file    Objective: Vital Signs: There were no vitals taken for this visit.   Physical Exam   Musculoskeletal Exam: ***  CDAI Exam: CDAI Score: Not documented Patient Global Assessment: Not documented; Provider Global Assessment: Not documented Swollen: Not documented; Tender: Not documented Joint Exam   Not documented   There is currently no information documented on the homunculus. Go to the Rheumatology activity and complete the homunculus  joint exam.  Investigation: No additional findings.  Imaging: No results found.  Recent Labs: Lab Results  Component Value Date   WBC 11.7 (H) 01/23/2012   HGB 15.2 (H) 01/23/2012   PLT 233 01/23/2012   NA 138 01/23/2012   K 4.0 01/23/2012   CL 99 01/23/2012   CO2 24 01/23/2012   GLUCOSE 137 (H) 01/23/2012   BUN 13 01/23/2012   CREATININE 0.75 01/23/2012   BILITOT 0.6 01/23/2012   ALKPHOS 106 01/23/2012   AST 14 01/23/2012   ALT 15 01/23/2012   PROT 7.7 01/23/2012   ALBUMIN 4.4 01/23/2012   CALCIUM 10.2 01/23/2012   GFRAA >90 01/23/2012    Speciality Comments: No specialty comments available.  Procedures:  No procedures performed Allergies: Other   Assessment / Plan:     Visit Diagnoses: No diagnosis found.   Orders: No orders of the defined types were placed in this encounter.  No orders of the defined types were placed in this encounter.   Face-to-face time spent with patient was *** minutes. Greater than 50% of time was spent in counseling and coordination of care.  Follow-Up Instructions: No follow-ups on file.   Earnestine Mealing, CMA  Note - This record has been created using Editor, commissioning.  Chart creation errors have been sought, but may not always  have been located. Such creation errors do not reflect on  the standard of medical care.

## 2018-01-07 NOTE — Progress Notes (Signed)
Office Visit Note  Patient: Glenda Matthews             Date of Birth: Dec 06, 1945           MRN: [id number]             PCP: Ernestene Kiel, MD Referring: Ernestene Kiel, MD Visit Date: 01/19/2018 Occupation: @GUAROCC @  Subjective:  Trochanteric bursitis bilaterally   History of Present Illness: Glenda Matthews is a 73 y.o. female with history of osteoarthritis and DDD.  Glenda Matthews states Glenda Matthews has occasional discomfort in both feet if Glenda Matthews walks for prolonged distances. Glenda Matthews wears orthotics in her tennis shoes. Glenda Matthews reports a history of plantar fasciitis. Glenda Matthews has occasional discomfort in both hands but denies any joint swelling.  Glenda Matthews denies any back pain at this time.  Glenda Matthews states that her ischial bursitis and trochanteric bursitis have improved significantly since going to PT at [facility]rology at [facility] long.  Glenda Matthews continues to perform stretching exercises at home.   Activities of Daily Living:  Patient reports morning stiffness for 0 minutes.   Patient Denies nocturnal pain.  Difficulty dressing/grooming: Denies Difficulty climbing stairs: Denies Difficulty getting out of chair: Denies Difficulty using hands for taps, buttons, cutlery, and/or writing: Denies  Review of Systems  Constitutional: Negative for fatigue.  HENT: Positive for mouth dryness. Negative for mouth sores and nose dryness.   Eyes: Positive for dryness. Negative for pain and visual disturbance.  Respiratory: Negative for cough, hemoptysis, shortness of breath and difficulty breathing.   Cardiovascular: Negative for chest pain, palpitations, hypertension and swelling in legs/feet.  Gastrointestinal: Negative for blood in stool, constipation and diarrhea.  Endocrine: Negative for increased urination.  Genitourinary: Negative for painful urination.  Musculoskeletal: Positive for arthralgias and joint pain. Negative for joint swelling, myalgias, muscle weakness, morning stiffness, muscle tenderness and myalgias.    Skin: Negative for color change, pallor, rash, hair loss, nodules/bumps, skin tightness, ulcers and sensitivity to sunlight.  Allergic/Immunologic: Negative for susceptible to infections.  Neurological: Negative for dizziness, numbness, headaches and weakness.  Hematological: Negative for swollen glands.  Psychiatric/Behavioral: Positive for depressed mood. Negative for sleep disturbance. The patient is nervous/anxious.     PMFS History:  There are no active problems to display for this patient.   Past Medical History:  Diagnosis Date  . Anemia    none in recent years  . Arthritis    shoulders  . Chronic kidney disease    kidney stone  . Headache(784.0)   . Liver cyst   . Sciatica     Family History  Problem Relation Age of Onset  . Cancer Mother    Past Surgical History:  Procedure Laterality Date  . ABDOMINAL HYSTERECTOMY    . BASAL CELL CARCINOMA EXCISION  12/2017  . BRAIN SURGERY  1998   shunt  . gall bladder    . skin caner    . tubialligation     Social History   Social History Narrative  . Not on file    Objective: Vital Signs: BP (!) 149/82 (BP Location: Left Arm, Patient Position: Sitting, Cuff Size: Normal)   Pulse 80   Resp 13   Ht 5\' 1"  (1.549 m)   Wt 148 lb 3.2 oz (67.2 kg)   BMI 28.00 kg/m    Physical Exam Vitals signs and nursing note reviewed.  Constitutional:      Appearance: Glenda Matthews is well-developed.  HENT:     Head: Normocephalic and atraumatic.  Eyes:  Conjunctiva/sclera: Conjunctivae normal.  Neck:     Musculoskeletal: Normal range of motion.  Cardiovascular:     Rate and Rhythm: Normal rate and regular rhythm.     Heart sounds: Normal heart sounds.  Pulmonary:     Effort: Pulmonary effort is normal.     Breath sounds: Normal breath sounds.  Abdominal:     General: Bowel sounds are normal.     Palpations: Abdomen is soft.  Lymphadenopathy:     Cervical: No cervical adenopathy.  Skin:    General: Skin is warm and dry.      Capillary Refill: Capillary refill takes less than 2 seconds.  Neurological:     Mental Status: Glenda Matthews is alert and oriented to person, place, and time.  Psychiatric:        Behavior: Behavior normal.      Musculoskeletal Exam: C-spine, thoracic spine, and lumbar spine good ROM.  No midline spinal tenderness.  No SI joint tenderness.  Shoulder joints, elbow joints, wrist joints, MCPs, PIPs, and DIPs good ROM with no synovitis.  Complete fist formation bilaterally.  DIP synovial thickening.  Hip joints, knee joints, ankle joints, MTPs, PIPs, and DIPs good ROM with no synovitis.  No warmth or effusion of knee joints.  Tenderness over bilateral trochanteric bursa.    CDAI Exam: CDAI Score: Not documented Patient Global Assessment: Not documented; Provider Global Assessment: Not documented Swollen: Not documented; Tender: Not documented Joint Exam   Not documented   There is currently no information documented on the homunculus. Go to the Rheumatology activity and complete the homunculus joint exam.  Investigation: No additional findings.  Imaging: No results found.  Recent Labs: Lab Results  Component Value Date   WBC 11.7 (H) 01/23/2012   HGB 15.2 (H) 01/23/2012   PLT 233 01/23/2012   NA 138 01/23/2012   K 4.0 01/23/2012   CL 99 01/23/2012   CO2 24 01/23/2012   GLUCOSE 137 (H) 01/23/2012   BUN 13 01/23/2012   CREATININE 0.75 01/23/2012   BILITOT 0.6 01/23/2012   ALKPHOS 106 01/23/2012   AST 14 01/23/2012   ALT 15 01/23/2012   PROT 7.7 01/23/2012   ALBUMIN 4.4 01/23/2012   CALCIUM 10.2 01/23/2012   GFRAA >90 01/23/2012    Speciality Comments: No specialty comments available.  Procedures:  No procedures performed Allergies: Other   Assessment / Plan:     Visit Diagnoses: Primary osteoarthritis of both hands - U/s of bilateral hands 06/09/2017 negative for synovitis. Bilateral median nerves WNL. XR:PIP, DIP,CMC, and MCP joint narrowing, no erosions RF+, CCP-, 14-3-3 neg:  Glenda Matthews has no active synovitis on exam.  Glenda Matthews has intermittent discomfort in both hands.  Glenda Matthews had DIP synovial thickening bilaterally.  Glenda Matthews has complete fist formation.  Joint protection and muscle strengthening were discussed. Glenda Matthews was advised to notify us if Glenda Matthews develops increased joint pain or joint swelling.  Glenda Matthews will follow up in 1 year.   Primary osteoarthritis of both feet: Glenda Matthews has PIP and DIP synovial thickening consistent with osteoarthritis of both feet.  Glenda Matthews has no synovitis.    Ischial bursitis, unspecified laterality -Her symptoms have improved significantly. Glenda Matthews went to PT at [facility] at [facility] which was helpful.  Glenda Matthews has tried using a cushion in the past which was not helpful.  Glenda Matthews continues to perform stretching exercises regularly.    Trochanteric bursitis of both hips: Glenda Matthews had mild tenderness of bilateral trochanteric bursa.  Glenda Matthews performs stretching exercises on a regular  basis.  Glenda Matthews went to PT which improved her discomfort significantly.   Chronic SI joint pain: Resolved.  Glenda Matthews has no SI joint tenderness on exam.   DDD (degenerative disc disease), thoracic: No midline spinal tenderness.    DDD (degenerative disc disease), lumbar: Glenda Matthews had no midline spinal tenderness.  Glenda Matthews has good ROM with no discomfort.   Other medical conditions are listed as follows:   Family history of psoriasis - Grandfather  Hepatic cyst  Renal cyst  History of kidney stones  Lung nodule  History of diverticulosis  Aortic atherosclerosis ([place])   Orders: No orders of the defined types were placed in this encounter.  No orders of the defined types were placed in this encounter.    Follow-Up Instructions: Return in about 1 year (around 01/20/2019) for Osteoarthritis, DDD.   Ofilia Neas, PA-C   I examined and evaluated the patient with Glenda Sams PA.  Glenda Matthews continues to have some stiffness in her hands.  On my examination Glenda Matthews has DIP and CMC prominence.  Her sharp bursitis and  trochanteric bursitis is much improved after physical therapy.  Continue with the exercises at home.  The plan of care was discussed as noted above.  Bo Merino, MD  Note - This record has been created using Editor, commissioning.  Chart creation errors have been sought, but may not always  have been located. Such creation errors do not reflect on  the standard of medical care.

## 2018-01-10 DIAGNOSIS — L570 Actinic keratosis: Secondary | ICD-10-CM | POA: Diagnosis not present

## 2018-01-11 ENCOUNTER — Ambulatory Visit: Payer: Medicare Other | Admitting: Rheumatology

## 2018-01-11 DIAGNOSIS — H02831 Dermatochalasis of right upper eyelid: Secondary | ICD-10-CM | POA: Diagnosis not present

## 2018-01-11 DIAGNOSIS — H18413 Arcus senilis, bilateral: Secondary | ICD-10-CM | POA: Diagnosis not present

## 2018-01-11 DIAGNOSIS — H2511 Age-related nuclear cataract, right eye: Secondary | ICD-10-CM | POA: Diagnosis not present

## 2018-01-11 DIAGNOSIS — H2513 Age-related nuclear cataract, bilateral: Secondary | ICD-10-CM | POA: Diagnosis not present

## 2018-01-11 DIAGNOSIS — H25013 Cortical age-related cataract, bilateral: Secondary | ICD-10-CM | POA: Diagnosis not present

## 2018-01-11 DIAGNOSIS — H25043 Posterior subcapsular polar age-related cataract, bilateral: Secondary | ICD-10-CM | POA: Diagnosis not present

## 2018-01-19 ENCOUNTER — Ambulatory Visit (INDEPENDENT_AMBULATORY_CARE_PROVIDER_SITE_OTHER): Payer: Medicare Other | Admitting: Rheumatology

## 2018-01-19 ENCOUNTER — Encounter: Payer: Self-pay | Admitting: Rheumatology

## 2018-01-19 VITALS — BP 149/82 | HR 80 | Resp 13 | Ht 61.0 in | Wt 148.2 lb

## 2018-01-19 DIAGNOSIS — M7070 Other bursitis of hip, unspecified hip: Secondary | ICD-10-CM | POA: Diagnosis not present

## 2018-01-19 DIAGNOSIS — M7061 Trochanteric bursitis, right hip: Secondary | ICD-10-CM | POA: Diagnosis not present

## 2018-01-19 DIAGNOSIS — Z8719 Personal history of other diseases of the digestive system: Secondary | ICD-10-CM

## 2018-01-19 DIAGNOSIS — M19071 Primary osteoarthritis, right ankle and foot: Secondary | ICD-10-CM | POA: Diagnosis not present

## 2018-01-19 DIAGNOSIS — M19072 Primary osteoarthritis, left ankle and foot: Secondary | ICD-10-CM

## 2018-01-19 DIAGNOSIS — M533 Sacrococcygeal disorders, not elsewhere classified: Secondary | ICD-10-CM | POA: Diagnosis not present

## 2018-01-19 DIAGNOSIS — K7689 Other specified diseases of liver: Secondary | ICD-10-CM

## 2018-01-19 DIAGNOSIS — M19041 Primary osteoarthritis, right hand: Secondary | ICD-10-CM

## 2018-01-19 DIAGNOSIS — M51369 Other intervertebral disc degeneration, lumbar region without mention of lumbar back pain or lower extremity pain: Secondary | ICD-10-CM

## 2018-01-19 DIAGNOSIS — N281 Cyst of kidney, acquired: Secondary | ICD-10-CM

## 2018-01-19 DIAGNOSIS — Z87442 Personal history of urinary calculi: Secondary | ICD-10-CM | POA: Diagnosis not present

## 2018-01-19 DIAGNOSIS — Z840 Family history of diseases of the skin and subcutaneous tissue: Secondary | ICD-10-CM

## 2018-01-19 DIAGNOSIS — M5136 Other intervertebral disc degeneration, lumbar region: Secondary | ICD-10-CM

## 2018-01-19 DIAGNOSIS — R911 Solitary pulmonary nodule: Secondary | ICD-10-CM | POA: Diagnosis not present

## 2018-01-19 DIAGNOSIS — G8929 Other chronic pain: Secondary | ICD-10-CM

## 2018-01-19 DIAGNOSIS — M5134 Other intervertebral disc degeneration, thoracic region: Secondary | ICD-10-CM

## 2018-01-19 DIAGNOSIS — M19042 Primary osteoarthritis, left hand: Secondary | ICD-10-CM

## 2018-01-19 DIAGNOSIS — I700 Atherosclerosis of aorta: Secondary | ICD-10-CM

## 2018-01-19 DIAGNOSIS — M7062 Trochanteric bursitis, left hip: Secondary | ICD-10-CM

## 2018-02-01 DIAGNOSIS — Z6827 Body mass index (BMI) 27.0-27.9, adult: Secondary | ICD-10-CM | POA: Diagnosis not present

## 2018-02-01 DIAGNOSIS — E663 Overweight: Secondary | ICD-10-CM | POA: Diagnosis not present

## 2018-02-01 DIAGNOSIS — J0100 Acute maxillary sinusitis, unspecified: Secondary | ICD-10-CM | POA: Diagnosis not present

## 2018-02-18 DIAGNOSIS — J0100 Acute maxillary sinusitis, unspecified: Secondary | ICD-10-CM | POA: Diagnosis not present

## 2018-02-21 DIAGNOSIS — Z961 Presence of intraocular lens: Secondary | ICD-10-CM | POA: Diagnosis not present

## 2018-02-21 DIAGNOSIS — H2511 Age-related nuclear cataract, right eye: Secondary | ICD-10-CM | POA: Diagnosis not present

## 2018-02-21 DIAGNOSIS — H25811 Combined forms of age-related cataract, right eye: Secondary | ICD-10-CM | POA: Diagnosis not present

## 2018-02-21 DIAGNOSIS — H52201 Unspecified astigmatism, right eye: Secondary | ICD-10-CM | POA: Diagnosis not present

## 2018-02-21 DIAGNOSIS — Z9841 Cataract extraction status, right eye: Secondary | ICD-10-CM | POA: Diagnosis not present

## 2018-02-22 DIAGNOSIS — H2512 Age-related nuclear cataract, left eye: Secondary | ICD-10-CM | POA: Diagnosis not present

## 2018-03-05 DIAGNOSIS — S80912A Unspecified superficial injury of left knee, initial encounter: Secondary | ICD-10-CM | POA: Diagnosis not present

## 2018-03-05 DIAGNOSIS — S93401A Sprain of unspecified ligament of right ankle, initial encounter: Secondary | ICD-10-CM | POA: Diagnosis not present

## 2018-03-14 DIAGNOSIS — H2512 Age-related nuclear cataract, left eye: Secondary | ICD-10-CM | POA: Diagnosis not present

## 2018-03-14 DIAGNOSIS — Z9841 Cataract extraction status, right eye: Secondary | ICD-10-CM | POA: Diagnosis not present

## 2018-03-14 DIAGNOSIS — Z961 Presence of intraocular lens: Secondary | ICD-10-CM | POA: Diagnosis not present

## 2018-03-14 DIAGNOSIS — H25812 Combined forms of age-related cataract, left eye: Secondary | ICD-10-CM | POA: Diagnosis not present

## 2018-03-14 DIAGNOSIS — Z9842 Cataract extraction status, left eye: Secondary | ICD-10-CM | POA: Diagnosis not present

## 2018-08-05 ENCOUNTER — Other Ambulatory Visit: Payer: Self-pay

## 2018-09-22 DIAGNOSIS — Z1231 Encounter for screening mammogram for malignant neoplasm of breast: Secondary | ICD-10-CM | POA: Diagnosis not present

## 2018-09-22 DIAGNOSIS — Z7189 Other specified counseling: Secondary | ICD-10-CM | POA: Diagnosis not present

## 2018-09-22 DIAGNOSIS — Z1331 Encounter for screening for depression: Secondary | ICD-10-CM | POA: Diagnosis not present

## 2018-09-22 DIAGNOSIS — Z6827 Body mass index (BMI) 27.0-27.9, adult: Secondary | ICD-10-CM | POA: Diagnosis not present

## 2018-09-22 DIAGNOSIS — Z23 Encounter for immunization: Secondary | ICD-10-CM | POA: Diagnosis not present

## 2018-09-22 DIAGNOSIS — R1011 Right upper quadrant pain: Secondary | ICD-10-CM | POA: Diagnosis not present

## 2018-09-22 DIAGNOSIS — Z0000 Encounter for general adult medical examination without abnormal findings: Secondary | ICD-10-CM | POA: Diagnosis not present

## 2018-09-22 DIAGNOSIS — Z1339 Encounter for screening examination for other mental health and behavioral disorders: Secondary | ICD-10-CM | POA: Diagnosis not present

## 2018-10-05 DIAGNOSIS — H6691 Otitis media, unspecified, right ear: Secondary | ICD-10-CM | POA: Diagnosis not present

## 2018-10-05 DIAGNOSIS — H6091 Unspecified otitis externa, right ear: Secondary | ICD-10-CM | POA: Diagnosis not present

## 2018-10-10 DIAGNOSIS — S09391A Other specified injury of right middle and inner ear, initial encounter: Secondary | ICD-10-CM | POA: Diagnosis not present

## 2018-10-12 DIAGNOSIS — H9113 Presbycusis, bilateral: Secondary | ICD-10-CM | POA: Diagnosis not present

## 2018-10-12 DIAGNOSIS — H903 Sensorineural hearing loss, bilateral: Secondary | ICD-10-CM | POA: Diagnosis not present

## 2019-01-19 ENCOUNTER — Ambulatory Visit: Payer: Self-pay | Admitting: Rheumatology

## 2019-03-02 NOTE — Progress Notes (Deleted)
Office Visit Note  Patient: Glenda Matthews             Date of Birth: Sep 18, 1945           MRN: [id number]             PCP: Glenda Kiel, MD Referring: Glenda Kiel, MD Visit Date: 03/07/2019 Occupation: @GUAROCC @  Subjective:  No chief complaint on file.   History of Present Illness: Glenda Matthews is a 74 y.o. female ***   Activities of Daily Living:  Patient reports morning stiffness for *** {minute/hour:19697}.   Patient {ACTIONS;DENIES/REPORTS:21021675::"Denies"} nocturnal pain.  Difficulty dressing/grooming: {ACTIONS;DENIES/REPORTS:21021675::"Denies"} Difficulty climbing stairs: {ACTIONS;DENIES/REPORTS:21021675::"Denies"} Difficulty getting out of chair: {ACTIONS;DENIES/REPORTS:21021675::"Denies"} Difficulty using hands for taps, buttons, cutlery, and/or writing: {ACTIONS;DENIES/REPORTS:21021675::"Denies"}  No Rheumatology ROS completed.   PMFS History:  There are no problems to display for this patient.   Past Medical History:  Diagnosis Date  . Anemia    none in recent years  . Arthritis    shoulders  . Chronic kidney disease    kidney stone  . Headache(784.0)   . Liver cyst   . Sciatica     Family History  Problem Relation Age of Onset  . Cancer Mother    Past Surgical History:  Procedure Laterality Date  . ABDOMINAL HYSTERECTOMY    . BASAL CELL CARCINOMA EXCISION  12/2017  . BRAIN SURGERY  1998   shunt  . gall bladder    . skin caner    . tubialligation     Social History   Social History Narrative  . Not on file    There is no immunization history on file for this patient.   Objective: Vital Signs: There were no vitals taken for this visit.   Physical Exam   Musculoskeletal Exam: ***  CDAI Exam: CDAI Score: -- Patient Global: --; Provider Global: -- Swollen: --; Tender: -- Joint Exam 03/07/2019   No joint exam has been documented for this visit   There is currently no information documented on the homunculus. Go  to the Rheumatology activity and complete the homunculus joint exam.  Investigation: No additional findings.  Imaging: No results found.  Recent Labs: Lab Results  Component Value Date   WBC 11.7 (H) 01/23/2012   HGB 15.2 (H) 01/23/2012   PLT 233 01/23/2012   NA 138 01/23/2012   K 4.0 01/23/2012   CL 99 01/23/2012   CO2 24 01/23/2012   GLUCOSE 137 (H) 01/23/2012   BUN 13 01/23/2012   CREATININE 0.75 01/23/2012   BILITOT 0.6 01/23/2012   ALKPHOS 106 01/23/2012   AST 14 01/23/2012   ALT 15 01/23/2012   PROT 7.7 01/23/2012   ALBUMIN 4.4 01/23/2012   CALCIUM 10.2 01/23/2012   GFRAA >90 01/23/2012    Speciality Comments: No specialty comments available.  Procedures:  No procedures performed Allergies: Other   Assessment / Plan:     Visit Diagnoses: No diagnosis found.  Orders: No orders of the defined types were placed in this encounter.  No orders of the defined types were placed in this encounter.   Face-to-face time spent with patient was *** minutes. Greater than 50% of time was spent in counseling and coordination of care.  Follow-Up Instructions: No follow-ups on file.   Earnestine Mealing, CMA  Note - This record has been created using Editor, commissioning.  Chart creation errors have been sought, but may not always  have been located. Such creation errors do not reflect on  the standard of medical care. 

## 2019-03-03 DIAGNOSIS — K209 Esophagitis, unspecified without bleeding: Secondary | ICD-10-CM | POA: Diagnosis not present

## 2019-03-03 DIAGNOSIS — R079 Chest pain, unspecified: Secondary | ICD-10-CM | POA: Diagnosis not present

## 2019-03-03 DIAGNOSIS — M1990 Unspecified osteoarthritis, unspecified site: Secondary | ICD-10-CM | POA: Diagnosis not present

## 2019-03-03 DIAGNOSIS — R0789 Other chest pain: Secondary | ICD-10-CM | POA: Diagnosis not present

## 2019-03-03 DIAGNOSIS — E7800 Pure hypercholesterolemia, unspecified: Secondary | ICD-10-CM | POA: Diagnosis not present

## 2019-03-03 DIAGNOSIS — Z7982 Long term (current) use of aspirin: Secondary | ICD-10-CM | POA: Diagnosis not present

## 2019-03-03 DIAGNOSIS — K2900 Acute gastritis without bleeding: Secondary | ICD-10-CM | POA: Diagnosis not present

## 2019-03-03 DIAGNOSIS — Z79899 Other long term (current) drug therapy: Secondary | ICD-10-CM | POA: Diagnosis not present

## 2019-03-03 DIAGNOSIS — I5189 Other ill-defined heart diseases: Secondary | ICD-10-CM | POA: Diagnosis not present

## 2019-03-03 DIAGNOSIS — K219 Gastro-esophageal reflux disease without esophagitis: Secondary | ICD-10-CM | POA: Diagnosis not present

## 2019-03-07 ENCOUNTER — Ambulatory Visit: Payer: Medicare Other | Admitting: Rheumatology

## 2019-03-15 DIAGNOSIS — Z87442 Personal history of urinary calculi: Secondary | ICD-10-CM | POA: Diagnosis not present

## 2019-03-15 DIAGNOSIS — R030 Elevated blood-pressure reading, without diagnosis of hypertension: Secondary | ICD-10-CM | POA: Diagnosis not present

## 2019-03-15 DIAGNOSIS — R1011 Right upper quadrant pain: Secondary | ICD-10-CM | POA: Diagnosis not present

## 2019-03-15 DIAGNOSIS — Z6825 Body mass index (BMI) 25.0-25.9, adult: Secondary | ICD-10-CM | POA: Diagnosis not present

## 2019-03-15 DIAGNOSIS — R21 Rash and other nonspecific skin eruption: Secondary | ICD-10-CM | POA: Diagnosis not present

## 2019-03-28 DIAGNOSIS — K7689 Other specified diseases of liver: Secondary | ICD-10-CM | POA: Diagnosis not present

## 2019-03-28 DIAGNOSIS — R1011 Right upper quadrant pain: Secondary | ICD-10-CM | POA: Diagnosis not present

## 2019-03-28 DIAGNOSIS — Z87442 Personal history of urinary calculi: Secondary | ICD-10-CM | POA: Diagnosis not present

## 2019-03-28 DIAGNOSIS — N200 Calculus of kidney: Secondary | ICD-10-CM | POA: Diagnosis not present

## 2019-04-26 NOTE — Progress Notes (Signed)
Office Visit Note  Patient: Glenda Matthews             Date of Birth: Jul 19, 1945           MRN: [id number]             PCP: Ernestene Kiel, MD Referring: Ernestene Kiel, MD Visit Date: 05/02/2019 Occupation: @GUAROCC @  Subjective:  Right trochanteric bursitis   History of Present Illness: [name] is a 74 y.o. female with history of osteoarthritis and DDD. She states she has occasional pain in both hands but denies any joint swelling.  She states her feet are doing well.  She wears proper fitting shoes.  She denies any neck or lower back pain at this time.  She states she has occasional discomfort in the right shoulder joint. She has ongoing trochanteric bursitis of the right hip.  She states she was performing daily stretching exercises, but she states she has not been doing them which has exacerbated her discomfort.   Activities of Daily Living:  Patient reports morning stiffness for 30 minutes.   Patient Denies nocturnal pain.  Difficulty dressing/grooming: Denies Difficulty climbing stairs: Denies Difficulty getting out of chair: Reports Difficulty using hands for taps, buttons, cutlery, and/or writing: Reports  Review of Systems  Constitutional: Positive for fatigue.  HENT: Positive for mouth dryness. Negative for mouth sores and nose dryness.   Eyes: Negative for pain, visual disturbance and dryness.  Respiratory: Negative for cough, hemoptysis, shortness of breath and difficulty breathing.   Cardiovascular: Negative for chest pain, palpitations, hypertension and swelling in legs/feet.  Gastrointestinal: Negative for blood in stool, constipation and diarrhea.  Endocrine: Negative for excessive thirst and increased urination.  Genitourinary: Negative for difficulty urinating and painful urination.  Musculoskeletal: Positive for arthralgias, joint pain and morning stiffness. Negative for joint swelling, myalgias, muscle weakness, muscle tenderness and myalgias.    Skin: Positive for rash. Negative for color change, pallor, hair loss, nodules/bumps, skin tightness, ulcers and sensitivity to sunlight.  Allergic/Immunologic: Negative for susceptible to infections.  Neurological: Positive for numbness. Negative for dizziness, headaches and weakness.  Hematological: Negative for bruising/bleeding tendency and swollen glands.  Psychiatric/Behavioral: Positive for sleep disturbance. Negative for depressed mood. The patient is not nervous/anxious.     PMFS History:  There are no problems to display for this patient.   Past Medical History:  Diagnosis Date  . Anemia    none in recent years  . Arthritis    shoulders  . Chronic kidney disease    kidney stone  . Diverticulosis   . Headache(784.0)   . Liver cyst   . Osteoarthritis   . Sciatica     Family History  Problem Relation Age of Onset  . Cancer Mother    Past Surgical History:  Procedure Laterality Date  . ABDOMINAL HYSTERECTOMY    . BASAL CELL CARCINOMA EXCISION  12/2017  . BRAIN SURGERY  1998   shunt  . CHOLECYSTECTOMY    . gall bladder    . skin caner    . TUBAL LIGATION    . tubialligation     Social History   Social History Narrative  . Not on file    There is no immunization history on file for this patient.   Objective: Vital Signs: BP (!) 149/96 (BP Location: Left Arm, Patient Position: Sitting, Cuff Size: Normal)   Pulse 83   Resp 16   Ht 5\' 1"  (1.549 m)   Wt 145 lb 9.6  oz (66 kg)   BMI 27.51 kg/m    Physical Exam Vitals and nursing note reviewed.  Constitutional:      Appearance: She is well-developed.  HENT:     Head: Normocephalic and atraumatic.  Eyes:     Conjunctiva/sclera: Conjunctivae normal.  Pulmonary:     Effort: Pulmonary effort is normal.  Abdominal:     General: Bowel sounds are normal.     Palpations: Abdomen is soft.  Musculoskeletal:     Cervical back: Normal range of motion.  Lymphadenopathy:     Cervical: No cervical adenopathy.   Skin:    General: Skin is warm and dry.     Capillary Refill: Capillary refill takes less than 2 seconds.  Neurological:     Mental Status: She is alert and oriented to person, place, and time.  Psychiatric:        Behavior: Behavior normal.      Musculoskeletal Exam: C-spine, thoracic spine, and lumbar spine good ROM.  Shoulder joints, elbow joints, wrist joints, MCPs, PIPs, and DIPs good ROM with no synovitis.  PIP and DIP thickening consistent with osteoarthritis of both hands.  Hip joints, knee joints, ankle joints, MTPs, PIPs, and DIPs good ROM with no synovitis.  No warmth or effusion of knee joints.  No tenderness or swelling of ankle joints.  Prominence of bilateral 1st MTP joints. Tenderness over the right trochanteric bursa.   CDAI Exam: CDAI Score: -- Patient Global: --; Provider Global: -- Swollen: --; Tender: -- Joint Exam 05/02/2019   No joint exam has been documented for this visit   There is currently no information documented on the homunculus. Go to the Rheumatology activity and complete the homunculus joint exam.  Investigation: No additional findings.  Imaging: No results found.  Recent Labs: Lab Results  Component Value Date   WBC 11.7 (H) 01/23/2012   HGB 15.2 (H) 01/23/2012   PLT 233 01/23/2012   NA 138 01/23/2012   K 4.0 01/23/2012   CL 99 01/23/2012   CO2 24 01/23/2012   GLUCOSE 137 (H) 01/23/2012   BUN 13 01/23/2012   CREATININE 0.75 01/23/2012   BILITOT 0.6 01/23/2012   ALKPHOS 106 01/23/2012   AST 14 01/23/2012   ALT 15 01/23/2012   PROT 7.7 01/23/2012   ALBUMIN 4.4 01/23/2012   CALCIUM 10.2 01/23/2012   GFRAA >90 01/23/2012    Speciality Comments: No specialty comments available.  Procedures:  No procedures performed Allergies: Other   Assessment / Plan:     Visit Diagnoses: Primary osteoarthritis of both hands - U/s of bilateral hands 06/09/2017 negative for synovitis. Bilateral median nerves WNL. XR:PIP, DIP,CMC, and MCP joint  narrowing, no erosions RF+, CCP-, 14-3-3 neg: She has no tenderness or synovitis on exam.  She has PIP and DIP thickening consistent with osteoarthritis of both hands.  She has no clinical features of rheumatoid arthritis.  She has complete fist formation bilaterally.  She has occasional discomfort in her hands but has not noticed any inflammation.  Joint protection and muscle strengthening were discussed.  She was advised to notify us if she develops increased joint pain or joint swelling.  She will follow-up in 1 year.  Rheumatoid factor positive: She has no synovitis on exam.  No clinical features of rheumatoid arthritis noted.  Primary osteoarthritis of both feet: She has first MTP joint prominence bilaterally.  No tenderness or inflammation was noted.  She is wearing proper fitting shoes.  Ischial bursitis, unspecified laterality: Right side:  She was encouraged to perform stretching exercises daily.  She is given a handout of IT band exercises to perform.  Trochanteric bursitis of right hip: She has tenderness to palpation on exam.  According to the patient she was performing daily exercises for over 1 year but has gotten out of 13 which has exacerbated her discomfort.  She was given a handout of exercises to perform.  She declined a cortisone injection today.  She was advised to notify us if her symptoms persist or worsen.  Chronic SI joint pain - Resolved.  No tenderness to palpation on exam.    DDD (degenerative disc disease), thoracic: No midline spinal tenderness.  DDD (degenerative disc disease), lumbar:   TMJ (temporomandibular joint disorder)  Family history of psoriasis - Grandfather  Hepatic cyst  Renal cyst  History of kidney stones  Lung nodule  History of diverticulosis  Aortic atherosclerosis ([place])  Orders: No orders of the defined types were placed in this encounter.  No orders of the defined types were placed in this encounter.    Follow-Up Instructions:  Return in about 1 year (around 05/01/2020) for Osteoarthritis, DDD.   Hazel Sams, PA-C  I examined and evaluated the patient with Hazel Sams PA.  History of some discomfort from underlying osteoarthritis.  She also had right trochanteric bursitis on examination.  She will continue exercising for now.  The plan of care was discussed as noted above.  Bo Merino, MD  Note - This record has been created using Editor, commissioning.  Chart creation errors have been sought, but may not always  have been located. Such creation errors do not reflect on  the standard of medical care.

## 2019-05-02 ENCOUNTER — Ambulatory Visit (INDEPENDENT_AMBULATORY_CARE_PROVIDER_SITE_OTHER): Payer: Medicare Other | Admitting: Rheumatology

## 2019-05-02 ENCOUNTER — Encounter: Payer: Self-pay | Admitting: Rheumatology

## 2019-05-02 ENCOUNTER — Other Ambulatory Visit: Payer: Self-pay

## 2019-05-02 VITALS — BP 149/96 | HR 83 | Resp 16 | Ht 61.0 in | Wt 145.6 lb

## 2019-05-02 DIAGNOSIS — M5134 Other intervertebral disc degeneration, thoracic region: Secondary | ICD-10-CM

## 2019-05-02 DIAGNOSIS — M19041 Primary osteoarthritis, right hand: Secondary | ICD-10-CM | POA: Diagnosis not present

## 2019-05-02 DIAGNOSIS — M7061 Trochanteric bursitis, right hip: Secondary | ICD-10-CM | POA: Diagnosis not present

## 2019-05-02 DIAGNOSIS — M533 Sacrococcygeal disorders, not elsewhere classified: Secondary | ICD-10-CM

## 2019-05-02 DIAGNOSIS — Z87442 Personal history of urinary calculi: Secondary | ICD-10-CM

## 2019-05-02 DIAGNOSIS — M7070 Other bursitis of hip, unspecified hip: Secondary | ICD-10-CM

## 2019-05-02 DIAGNOSIS — G8929 Other chronic pain: Secondary | ICD-10-CM

## 2019-05-02 DIAGNOSIS — Z8719 Personal history of other diseases of the digestive system: Secondary | ICD-10-CM

## 2019-05-02 DIAGNOSIS — I700 Atherosclerosis of aorta: Secondary | ICD-10-CM

## 2019-05-02 DIAGNOSIS — M19071 Primary osteoarthritis, right ankle and foot: Secondary | ICD-10-CM | POA: Diagnosis not present

## 2019-05-02 DIAGNOSIS — M19042 Primary osteoarthritis, left hand: Secondary | ICD-10-CM

## 2019-05-02 DIAGNOSIS — M26609 Unspecified temporomandibular joint disorder, unspecified side: Secondary | ICD-10-CM

## 2019-05-02 DIAGNOSIS — N281 Cyst of kidney, acquired: Secondary | ICD-10-CM | POA: Diagnosis not present

## 2019-05-02 DIAGNOSIS — Z840 Family history of diseases of the skin and subcutaneous tissue: Secondary | ICD-10-CM | POA: Diagnosis not present

## 2019-05-02 DIAGNOSIS — R768 Other specified abnormal immunological findings in serum: Secondary | ICD-10-CM | POA: Diagnosis not present

## 2019-05-02 DIAGNOSIS — M5136 Other intervertebral disc degeneration, lumbar region: Secondary | ICD-10-CM | POA: Diagnosis not present

## 2019-05-02 DIAGNOSIS — M19072 Primary osteoarthritis, left ankle and foot: Secondary | ICD-10-CM

## 2019-05-02 DIAGNOSIS — K7689 Other specified diseases of liver: Secondary | ICD-10-CM | POA: Diagnosis not present

## 2019-05-02 DIAGNOSIS — R911 Solitary pulmonary nodule: Secondary | ICD-10-CM

## 2019-05-02 NOTE — Patient Instructions (Addendum)
Iliotibial Band Syndrome Rehab Ask your health care provider which exercises are safe for you. Do exercises exactly as told by your health care provider and adjust them as directed. It is normal to feel mild stretching, pulling, tightness, or discomfort as you do these exercises. Stop right away if you feel sudden pain or your pain gets significantly worse. Do not begin these exercises until told by your health care provider. Stretching and range-of-motion exercises These exercises warm up your muscles and joints and improve the movement and flexibility of your hip and pelvis. Quadriceps stretch, prone  1. Lie on your abdomen on a firm surface, such as a bed or padded floor (prone position). 2. Bend your left / right knee and reach back to hold your ankle or pant leg. If you cannot reach your ankle or pant leg, loop a belt around your foot and grab the belt instead. 3. Gently pull your heel toward your buttocks. Your knee should not slide out to the side. You should feel a stretch in the front of your thigh and knee (quadriceps). 4. Hold this position for __________ seconds. Repeat __________ times. Complete this exercise __________ times a day. Iliotibial band stretch An iliotibial band is a strong band of muscle tissue that runs from the outer side of your hip to the outer side of your thigh and knee. 1. Lie on your side with your left / right leg in the top position. 2. Bend both of your knees and grab your left / right ankle. Stretch out your bottom arm to help you balance. 3. Slowly bring your top knee back so your thigh goes behind your trunk. 4. Slowly lower your top leg toward the floor until you feel a gentle stretch on the outside of your left / right hip and thigh. If you do not feel a stretch and your knee will not fall farther, place the heel of your other foot on top of your knee and pull your knee down toward the floor with your foot. 5. Hold this position for __________  seconds. Repeat __________ times. Complete this exercise __________ times a day. Strengthening exercises These exercises build strength and endurance in your hip and pelvis. Endurance is the ability to use your muscles for a long time, even after they get tired. Straight leg raises, side-lying This exercise strengthens the muscles that rotate the leg at the hip and move it away from your body (hip abductors). 1. Lie on your side with your left / right leg in the top position. Lie so your head, shoulder, hip, and knee line up. You may bend your bottom knee to help you balance. 2. Roll your hips slightly forward so your hips are stacked directly over each other and your left / right knee is facing forward. 3. Tense the muscles in your outer thigh and lift your top leg 4-6 inches (10-15 cm). 4. Hold this position for __________ seconds. 5. Slowly return to the starting position. Let your muscles relax completely before doing another repetition. Repeat __________ times. Complete this exercise __________ times a day. Leg raises, prone This exercise strengthens the muscles that move the hips (hip extensors). 1. Lie on your abdomen on your bed or a firm surface. You can put a pillow under your hips if that is more comfortable for your lower back. 2. Bend your left / right knee so your foot is straight up in the air. 3. Squeeze your buttocks muscles and lift your left / right thigh   off the bed. Do not let your back arch. 4. Tense your thigh muscle as hard as you can without increasing any knee pain. 5. Hold this position for __________ seconds. 6. Slowly lower your leg to the starting position and allow it to relax completely. Repeat __________ times. Complete this exercise __________ times a day. Hip hike 1. Stand sideways on a bottom step. Stand on your left / right leg with your other foot unsupported next to the step. You can hold on to the railing or wall for balance if needed. 2. Keep your knees  straight and your torso square. Then lift your left / right hip up toward the ceiling. 3. Slowly let your left / right hip lower toward the floor, past the starting position. Your foot should get closer to the floor. Do not lean or bend your knees. Repeat __________ times. Complete this exercise __________ times a day. This information is not intended to replace advice given to you by your health care provider. Make sure you discuss any questions you have with your health care provider. Document Revised: 04/14/2018 Document Reviewed: 10/13/2017 Elsevier Patient Education  2020 [place]. Hip Bursitis Rehab Ask your health care provider which exercises are safe for you. Do exercises exactly as told by your health care provider and adjust them as directed. It is normal to feel mild stretching, pulling, tightness, or discomfort as you do these exercises. Stop right away if you feel sudden pain or your pain gets worse. Do not begin these exercises until told by your health care provider. Stretching exercise This exercise warms up your muscles and joints and improves the movement and flexibility of your hip. This exercise also helps to relieve pain and stiffness. Iliotibial band stretch An iliotibial band is a strong band of muscle tissue that runs from the outer side of your hip to the outer side of your thigh and knee. 1. Lie on your side with your left / right leg in the top position. 2. Bend your left / right knee and grab your ankle. Stretch out your bottom arm to help you balance. 3. Slowly bring your knee back so your thigh is behind your body. 4. Slowly lower your knee toward the floor until you feel a gentle stretch on the outside of your left / right thigh. If you do not feel a stretch and your knee will not fall farther, place the heel of your other foot on top of your knee and pull your knee down toward the floor with your foot. 5. Hold this position for __________ seconds. 6. Slowly  return to the starting position. Repeat __________ times. Complete this exercise __________ times a day. Strengthening exercises These exercises build strength and endurance in your hip and pelvis. Endurance is the ability to use your muscles for a long time, even after they get tired. Bridge This exercise strengthens the muscles that move your thigh backward (hip extensors). 1. Lie on your back on a firm surface with your knees bent and your feet flat on the floor. 2. Tighten your buttocks muscles and lift your buttocks off the floor until your trunk is level with your thighs. ? Do not arch your back. ? You should feel the muscles working in your buttocks and the back of your thighs. If you do not feel these muscles, slide your feet 1-2 inches (2.5-5 cm) farther away from your buttocks. ? If this exercise is too easy, try doing it with your arms crossed over your  chest. 3. Hold this position for __________ seconds. 4. Slowly lower your hips to the starting position. 5. Let your muscles relax completely after each repetition. Repeat __________ times. Complete this exercise __________ times a day. Squats This exercise strengthens the muscles in front of your thigh and knee (quadriceps). 1. Stand in front of a table, with your feet and knees pointing straight ahead. You may rest your hands on the table for balance but not for support. 2. Slowly bend your knees and lower your hips like you are going to sit in a chair. ? Keep your weight over your heels, not over your toes. ? Keep your lower legs upright so they are parallel with the table legs. ? Do not let your hips go lower than your knees. ? Do not bend lower than told by your health care provider. ? If your hip pain increases, do not bend as low. 3. Hold the squat position for __________ seconds. 4. Slowly push with your legs to return to standing. Do not use your hands to pull yourself to standing. Repeat __________ times. Complete this  exercise __________ times a day. Hip hike 1. Stand sideways on a bottom step. Stand on your left / right leg with your other foot unsupported next to the step. You can hold on to the railing or wall for balance if needed. 2. Keep your knees straight and your torso square. Then lift your left / right hip up toward the ceiling. 3. Hold this position for __________ seconds. 4. Slowly let your left / right hip lower toward the floor, past the starting position. Your foot should get closer to the floor. Do not lean or bend your knees. Repeat __________ times. Complete this exercise __________ times a day. Single leg stand 1. Without shoes, stand near a railing or in a doorway. You may hold on to the railing or door frame as needed for balance. 2. Squeeze your left / right buttock muscles, then lift up your other foot. ? Do not let your left / right hip push out to the side. ? It is helpful to stand in front of a mirror for this exercise so you can watch your hip. 3. Hold this position for __________ seconds. Repeat __________ times. Complete this exercise __________ times a day. This information is not intended to replace advice given to you by your health care provider. Make sure you discuss any questions you have with your health care provider. Document Revised: 04/18/2018 Document Reviewed: 04/18/2018 Elsevier Patient Education  [place].  Shoulder Exercises Ask your health care provider which exercises are safe for you. Do exercises exactly as told by your health care provider and adjust them as directed. It is normal to feel mild stretching, pulling, tightness, or discomfort as you do these exercises. Stop right away if you feel sudden pain or your pain gets worse. Do not begin these exercises until told by your health care provider. Stretching exercises External rotation and abduction This exercise is sometimes called corner stretch. This exercise rotates your arm outward (external  rotation) and moves your arm out from your body (abduction). 5. Stand in a doorway with one of your feet slightly in front of the other. This is called a staggered stance. If you cannot reach your forearms to the door frame, stand facing a corner of a room. 6. Choose one of the following positions as told by your health care provider: ? Place your hands and forearms on the door frame  above your head. ? Place your hands and forearms on the door frame at the height of your head. ? Place your hands on the door frame at the height of your elbows. 7. Slowly move your weight onto your front foot until you feel a stretch across your chest and in the front of your shoulders. Keep your head and chest upright and keep your abdominal muscles tight. 8. Hold for __________ seconds. 9. To release the stretch, shift your weight to your back foot. Repeat __________ times. Complete this exercise __________ times a day. Extension, standing 6. Stand and hold a broomstick, a cane, or a similar object behind your back. ? Your hands should be a little wider than shoulder width apart. ? Your palms should face away from your back. 7. Keeping your elbows straight and your shoulder muscles relaxed, move the stick away from your body until you feel a stretch in your shoulders (extension). ? Avoid shrugging your shoulders while you move the stick. Keep your shoulder blades tucked down toward the middle of your back. 8. Hold for __________ seconds. 9. Slowly return to the starting position. Repeat __________ times. Complete this exercise __________ times a day. Range-of-motion exercises Pendulum  6. Stand near a wall or a surface that you can hold onto for balance. 7. Bend at the waist and let your left / right arm hang straight down. Use your other arm to support you. Keep your back straight and do not lock your knees. 8. Relax your left / right arm and shoulder muscles, and move your hips and your trunk so your left /  right arm swings freely. Your arm should swing because of the motion of your body, not because you are using your arm or shoulder muscles. 9. Keep moving your hips and trunk so your arm swings in the following directions, as told by your health care provider: ? Side to side. ? Forward and backward. ? In clockwise and counterclockwise circles. 10. Continue each motion for __________ seconds, or for as long as told by your health care provider. 11. Slowly return to the starting position. Repeat __________ times. Complete this exercise __________ times a day. Shoulder flexion, standing  7. Stand and hold a broomstick, a cane, or a similar object. Place your hands a little more than shoulder width apart on the object. Your left / right hand should be palm up, and your other hand should be palm down. 8. Keep your elbow straight and your shoulder muscles relaxed. Push the stick up with your healthy arm to raise your left / right arm in front of your body, and then over your head until you feel a stretch in your shoulder (flexion). ? Avoid shrugging your shoulder while you raise your arm. Keep your shoulder blade tucked down toward the middle of your back. 9. Hold for __________ seconds. 10. Slowly return to the starting position. Repeat __________ times. Complete this exercise __________ times a day. Shoulder abduction, standing 4. Stand and hold a broomstick, a cane, or a similar object. Place your hands a little more than shoulder width apart on the object. Your left / right hand should be palm up, and your other hand should be palm down. 5. Keep your elbow straight and your shoulder muscles relaxed. Push the object across your body toward your left / right side. Raise your left / right arm to the side of your body (abduction) until you feel a stretch in your shoulder. ? Do not raise your  arm above shoulder height unless your health care provider tells you to do that. ? If directed, raise your arm  over your head. ? Avoid shrugging your shoulder while you raise your arm. Keep your shoulder blade tucked down toward the middle of your back. 6. Hold for __________ seconds. 7. Slowly return to the starting position. Repeat __________ times. Complete this exercise __________ times a day. Internal rotation  1. Place your left / right hand behind your back, palm up. 2. Use your other hand to dangle an exercise band, a towel, or a similar object over your shoulder. Grasp the band with your left / right hand so you are holding on to both ends. 3. Gently pull up on the band until you feel a stretch in the front of your left / right shoulder. The movement of your arm toward the center of your body is called internal rotation. ? Avoid shrugging your shoulder while you raise your arm. Keep your shoulder blade tucked down toward the middle of your back. 4. Hold for __________ seconds. 5. Release the stretch by letting go of the band and lowering your hands. Repeat __________ times. Complete this exercise __________ times a day. Strengthening exercises External rotation  1. Sit in a stable chair without armrests. 2. Secure an exercise band to a stable object at elbow height on your left / right side. 3. Place a soft object, such as a folded towel or a small pillow, between your left / right upper arm and your body to move your elbow about 4 inches (10 cm) away from your side. 4. Hold the end of the exercise band so it is tight and there is no slack. 5. Keeping your elbow pressed against the soft object, slowly move your forearm out, away from your abdomen (external rotation). Keep your body steady so only your forearm moves. 6. Hold for __________ seconds. 7. Slowly return to the starting position. Repeat __________ times. Complete this exercise __________ times a day. Shoulder abduction  1. Sit in a stable chair without armrests, or stand up. 2. Hold a __________ weight in your left / right hand,  or hold an exercise band with both hands. 3. Start with your arms straight down and your left / right palm facing in, toward your body. 4. Slowly lift your left / right hand out to your side (abduction). Do not lift your hand above shoulder height unless your health care provider tells you that this is safe. ? Keep your arms straight. ? Avoid shrugging your shoulder while you do this movement. Keep your shoulder blade tucked down toward the middle of your back. 5. Hold for __________ seconds. 6. Slowly lower your arm, and return to the starting position. Repeat __________ times. Complete this exercise __________ times a day. Shoulder extension 1. Sit in a stable chair without armrests, or stand up. 2. Secure an exercise band to a stable object in front of you so it is at shoulder height. 3. Hold one end of the exercise band in each hand. Your palms should face each other. 4. Straighten your elbows and lift your hands up to shoulder height. 5. Step back, away from the secured end of the exercise band, until the band is tight and there is no slack. 6. Squeeze your shoulder blades together as you pull your hands down to the sides of your thighs (extension). Stop when your hands are straight down by your sides. Do not let your hands go behind your body.  7. Hold for __________ seconds. 8. Slowly return to the starting position. Repeat __________ times. Complete this exercise __________ times a day. Shoulder row 1. Sit in a stable chair without armrests, or stand up. 2. Secure an exercise band to a stable object in front of you so it is at waist height. 3. Hold one end of the exercise band in each hand. Position your palms so that your thumbs are facing the ceiling (neutral position). 4. Bend each of your elbows to a 90-degree angle (right angle) and keep your upper arms at your sides. 5. Step back until the band is tight and there is no slack. 6. Slowly pull your elbows back behind you. 7. Hold  for __________ seconds. 8. Slowly return to the starting position. Repeat __________ times. Complete this exercise __________ times a day. Shoulder press-ups  1. Sit in a stable chair that has armrests. Sit upright, with your feet flat on the floor. 2. Put your hands on the armrests so your elbows are bent and your fingers are pointing forward. Your hands should be about even with the sides of your body. 3. Push down on the armrests and use your arms to lift yourself off the chair. Straighten your elbows and lift yourself up as much as you comfortably can. ? Move your shoulder blades down, and avoid letting your shoulders move up toward your ears. ? Keep your feet on the ground. As you get stronger, your feet should support less of your body weight as you lift yourself up. 4. Hold for __________ seconds. 5. Slowly lower yourself back into the chair. Repeat __________ times. Complete this exercise __________ times a day. Wall push-ups  1. Stand so you are facing a stable wall. Your feet should be about one arm-length away from the wall. 2. Lean forward and place your palms on the wall at shoulder height. 3. Keep your feet flat on the floor as you bend your elbows and lean forward toward the wall. 4. Hold for __________ seconds. 5. Straighten your elbows to push yourself back to the starting position. Repeat __________ times. Complete this exercise __________ times a day. This information is not intended to replace advice given to you by your health care provider. Make sure you discuss any questions you have with your health care provider. Document Revised: 04/15/2018 Document Reviewed: 01/21/2018 Elsevier Patient Education  [place].

## 2019-05-12 DIAGNOSIS — Z1331 Encounter for screening for depression: Secondary | ICD-10-CM | POA: Diagnosis not present

## 2019-05-12 DIAGNOSIS — I700 Atherosclerosis of aorta: Secondary | ICD-10-CM | POA: Diagnosis not present

## 2019-05-12 DIAGNOSIS — R030 Elevated blood-pressure reading, without diagnosis of hypertension: Secondary | ICD-10-CM | POA: Diagnosis not present

## 2019-05-12 DIAGNOSIS — R21 Rash and other nonspecific skin eruption: Secondary | ICD-10-CM | POA: Diagnosis not present

## 2019-05-12 DIAGNOSIS — Z7189 Other specified counseling: Secondary | ICD-10-CM | POA: Diagnosis not present

## 2019-05-12 DIAGNOSIS — K219 Gastro-esophageal reflux disease without esophagitis: Secondary | ICD-10-CM | POA: Diagnosis not present

## 2019-05-12 DIAGNOSIS — M7061 Trochanteric bursitis, right hip: Secondary | ICD-10-CM | POA: Diagnosis not present

## 2019-09-25 DIAGNOSIS — Z1331 Encounter for screening for depression: Secondary | ICD-10-CM | POA: Diagnosis not present

## 2019-09-25 DIAGNOSIS — R1011 Right upper quadrant pain: Secondary | ICD-10-CM | POA: Diagnosis not present

## 2019-09-25 DIAGNOSIS — E2839 Other primary ovarian failure: Secondary | ICD-10-CM | POA: Diagnosis not present

## 2019-09-25 DIAGNOSIS — Z0001 Encounter for general adult medical examination with abnormal findings: Secondary | ICD-10-CM | POA: Diagnosis not present

## 2019-09-25 DIAGNOSIS — Z1339 Encounter for screening examination for other mental health and behavioral disorders: Secondary | ICD-10-CM | POA: Diagnosis not present

## 2019-09-25 DIAGNOSIS — R197 Diarrhea, unspecified: Secondary | ICD-10-CM | POA: Diagnosis not present

## 2019-09-25 DIAGNOSIS — R3989 Other symptoms and signs involving the genitourinary system: Secondary | ICD-10-CM | POA: Diagnosis not present

## 2019-09-25 DIAGNOSIS — R1032 Left lower quadrant pain: Secondary | ICD-10-CM | POA: Diagnosis not present

## 2019-09-25 DIAGNOSIS — R7309 Other abnormal glucose: Secondary | ICD-10-CM | POA: Diagnosis not present

## 2019-09-25 DIAGNOSIS — Z1231 Encounter for screening mammogram for malignant neoplasm of breast: Secondary | ICD-10-CM | POA: Diagnosis not present

## 2019-09-25 DIAGNOSIS — E785 Hyperlipidemia, unspecified: Secondary | ICD-10-CM | POA: Diagnosis not present

## 2019-09-25 DIAGNOSIS — Z1159 Encounter for screening for other viral diseases: Secondary | ICD-10-CM | POA: Diagnosis not present

## 2019-09-26 DIAGNOSIS — R197 Diarrhea, unspecified: Secondary | ICD-10-CM | POA: Diagnosis not present

## 2019-09-26 DIAGNOSIS — E785 Hyperlipidemia, unspecified: Secondary | ICD-10-CM | POA: Diagnosis not present

## 2019-09-26 DIAGNOSIS — R7309 Other abnormal glucose: Secondary | ICD-10-CM | POA: Diagnosis not present

## 2019-09-26 DIAGNOSIS — R1032 Left lower quadrant pain: Secondary | ICD-10-CM | POA: Diagnosis not present

## 2019-09-26 DIAGNOSIS — R1011 Right upper quadrant pain: Secondary | ICD-10-CM | POA: Diagnosis not present

## 2019-09-26 DIAGNOSIS — Z1159 Encounter for screening for other viral diseases: Secondary | ICD-10-CM | POA: Diagnosis not present

## 2019-09-28 DIAGNOSIS — I700 Atherosclerosis of aorta: Secondary | ICD-10-CM | POA: Diagnosis not present

## 2019-09-28 DIAGNOSIS — R1011 Right upper quadrant pain: Secondary | ICD-10-CM | POA: Diagnosis not present

## 2019-09-28 DIAGNOSIS — N1330 Unspecified hydronephrosis: Secondary | ICD-10-CM | POA: Diagnosis not present

## 2019-09-28 DIAGNOSIS — R197 Diarrhea, unspecified: Secondary | ICD-10-CM | POA: Diagnosis not present

## 2019-09-28 DIAGNOSIS — K760 Fatty (change of) liver, not elsewhere classified: Secondary | ICD-10-CM | POA: Diagnosis not present

## 2019-09-28 DIAGNOSIS — R1032 Left lower quadrant pain: Secondary | ICD-10-CM | POA: Diagnosis not present

## 2019-09-28 DIAGNOSIS — K5730 Diverticulosis of large intestine without perforation or abscess without bleeding: Secondary | ICD-10-CM | POA: Diagnosis not present

## 2019-10-11 DIAGNOSIS — M8588 Other specified disorders of bone density and structure, other site: Secondary | ICD-10-CM | POA: Diagnosis not present

## 2019-10-11 DIAGNOSIS — I700 Atherosclerosis of aorta: Secondary | ICD-10-CM | POA: Diagnosis not present

## 2019-10-11 DIAGNOSIS — Z6826 Body mass index (BMI) 26.0-26.9, adult: Secondary | ICD-10-CM | POA: Diagnosis not present

## 2019-10-11 DIAGNOSIS — M810 Age-related osteoporosis without current pathological fracture: Secondary | ICD-10-CM | POA: Diagnosis not present

## 2019-10-11 DIAGNOSIS — E1169 Type 2 diabetes mellitus with other specified complication: Secondary | ICD-10-CM | POA: Diagnosis not present

## 2019-10-11 DIAGNOSIS — E785 Hyperlipidemia, unspecified: Secondary | ICD-10-CM | POA: Diagnosis not present

## 2019-10-11 DIAGNOSIS — R928 Other abnormal and inconclusive findings on diagnostic imaging of breast: Secondary | ICD-10-CM | POA: Diagnosis not present

## 2019-10-11 DIAGNOSIS — N1330 Unspecified hydronephrosis: Secondary | ICD-10-CM | POA: Diagnosis not present

## 2019-10-11 DIAGNOSIS — E2839 Other primary ovarian failure: Secondary | ICD-10-CM | POA: Diagnosis not present

## 2019-10-11 DIAGNOSIS — Z1231 Encounter for screening mammogram for malignant neoplasm of breast: Secondary | ICD-10-CM | POA: Diagnosis not present

## 2019-10-11 DIAGNOSIS — M8589 Other specified disorders of bone density and structure, multiple sites: Secondary | ICD-10-CM | POA: Diagnosis not present

## 2019-10-11 DIAGNOSIS — Z780 Asymptomatic menopausal state: Secondary | ICD-10-CM | POA: Diagnosis not present

## 2019-10-19 DIAGNOSIS — Z87442 Personal history of urinary calculi: Secondary | ICD-10-CM | POA: Diagnosis not present

## 2019-10-19 DIAGNOSIS — N130 Hydronephrosis with ureteropelvic junction obstruction: Secondary | ICD-10-CM | POA: Diagnosis not present

## 2019-10-25 ENCOUNTER — Telehealth: Payer: Self-pay | Admitting: *Deleted

## 2019-10-25 DIAGNOSIS — E1169 Type 2 diabetes mellitus with other specified complication: Secondary | ICD-10-CM | POA: Diagnosis not present

## 2019-10-25 NOTE — Telephone Encounter (Signed)
Received DEXA results from [facility].  Date of Scan: 10/11/2019 Lowest T-score and site measured: -2.8 Left Femur Neck Significant changes in BMD and site measured (5% and above): n/a  Current Regimen: not currently on treatment  Recommendation: Please schedule an appointment to discuss results and treatment options.    Attempted to contact the patient and left message for patient to call the office.

## 2019-10-26 DIAGNOSIS — N130 Hydronephrosis with ureteropelvic junction obstruction: Secondary | ICD-10-CM | POA: Diagnosis not present

## 2019-10-26 DIAGNOSIS — Z87442 Personal history of urinary calculi: Secondary | ICD-10-CM | POA: Diagnosis not present

## 2019-10-26 DIAGNOSIS — R102 Pelvic and perineal pain: Secondary | ICD-10-CM | POA: Diagnosis not present

## 2019-11-01 DIAGNOSIS — N6322 Unspecified lump in the left breast, upper inner quadrant: Secondary | ICD-10-CM | POA: Diagnosis not present

## 2019-11-01 DIAGNOSIS — N6489 Other specified disorders of breast: Secondary | ICD-10-CM | POA: Diagnosis not present

## 2019-11-01 DIAGNOSIS — N6311 Unspecified lump in the right breast, upper outer quadrant: Secondary | ICD-10-CM | POA: Diagnosis not present

## 2019-11-01 DIAGNOSIS — N6315 Unspecified lump in the right breast, overlapping quadrants: Secondary | ICD-10-CM | POA: Diagnosis not present

## 2019-11-08 DIAGNOSIS — R059 Cough, unspecified: Secondary | ICD-10-CM | POA: Diagnosis not present

## 2019-11-08 DIAGNOSIS — U071 COVID-19: Secondary | ICD-10-CM | POA: Diagnosis not present

## 2019-11-08 DIAGNOSIS — Z20822 Contact with and (suspected) exposure to covid-19: Secondary | ICD-10-CM | POA: Diagnosis not present

## 2019-11-09 DIAGNOSIS — U071 COVID-19: Secondary | ICD-10-CM | POA: Diagnosis not present

## 2019-11-22 DIAGNOSIS — E1169 Type 2 diabetes mellitus with other specified complication: Secondary | ICD-10-CM | POA: Diagnosis not present

## 2019-12-05 DIAGNOSIS — N130 Hydronephrosis with ureteropelvic junction obstruction: Secondary | ICD-10-CM | POA: Diagnosis not present

## 2019-12-05 DIAGNOSIS — K5730 Diverticulosis of large intestine without perforation or abscess without bleeding: Secondary | ICD-10-CM | POA: Diagnosis not present

## 2019-12-05 DIAGNOSIS — N1330 Unspecified hydronephrosis: Secondary | ICD-10-CM | POA: Diagnosis not present

## 2019-12-25 DIAGNOSIS — N130 Hydronephrosis with ureteropelvic junction obstruction: Secondary | ICD-10-CM | POA: Diagnosis not present

## 2019-12-27 ENCOUNTER — Other Ambulatory Visit (HOSPITAL_COMMUNITY): Payer: Self-pay | Admitting: Urology

## 2019-12-27 ENCOUNTER — Other Ambulatory Visit: Payer: Self-pay | Admitting: Urology

## 2019-12-27 DIAGNOSIS — N130 Hydronephrosis with ureteropelvic junction obstruction: Secondary | ICD-10-CM

## 2020-01-03 NOTE — Progress Notes (Signed)
Office Visit Note  Patient: Glenda Matthews             Date of Birth: [birth year]           MRN: [id number]             PCP: Ernestene Kiel, MD Referring: Ernestene Kiel, MD Visit Date: 01/16/2020 Occupation: @GUAROCC @  Subjective:  Discuss DEXA results    History of Present Illness: Glenda Matthews is a 74 y.o. female with history of osteoarthritis and DDD.  Patient presents today to discuss DEXA results. She has been taking a calcium and vitamin D supplement.  She denies any recent falls or fractures.  She states she has noticed some increased "stumbling" in her town home, which she attributes to the use of area rugs. She states she is currently undergoing a workup at [facility] for "fluid around her right kidney" and she is awaiting imaging results. She does not think she has ever had abnormal kidney functions.   She states she does experience symptoms of reflux despite taking protonix as prescribed, so she is apprehensive to take a medication that may worsen reflux.     Activities of Daily Living:  Patient reports morning stiffness for 0 minutes.   Patient Denies nocturnal pain.  Difficulty dressing/grooming: Denies Difficulty climbing stairs: Denies Difficulty getting out of chair: Denies Difficulty using hands for taps, buttons, cutlery, and/or writing: Reports  Review of Systems  Constitutional: Positive for fatigue.  HENT: Positive for mouth dryness. Negative for mouth sores and nose dryness.   Eyes: Positive for itching. Negative for pain, visual disturbance and dryness.  Respiratory: Negative for cough, hemoptysis, shortness of breath and difficulty breathing.   Cardiovascular: Negative for chest pain, palpitations and swelling in legs/feet.  Gastrointestinal: Positive for abdominal pain. Negative for blood in stool, constipation and diarrhea.  Endocrine: Negative for increased urination.  Genitourinary: Negative for painful urination.  Musculoskeletal:  Positive for arthralgias, joint pain, myalgias, muscle weakness, muscle tenderness and myalgias. Negative for joint swelling and morning stiffness.  Skin: Negative for color change, rash and redness.  Allergic/Immunologic: Negative for susceptible to infections.  Neurological: Positive for headaches and memory loss. Negative for dizziness, numbness and weakness.  Hematological: Negative for swollen glands.  Psychiatric/Behavioral: Positive for confusion. Negative for sleep disturbance.    PMFS History:  There are no problems to display for this patient.   Past Medical History:  Diagnosis Date  . Anemia    none in recent years  . Arthritis    shoulders  . Chronic kidney disease    kidney stone  . Diverticulosis   . Headache(784.0)   . Liver cyst   . Osteoarthritis   . Sciatica     Family History  Problem Relation Age of Onset  . Cancer Mother    Past Surgical History:  Procedure Laterality Date  . ABDOMINAL HYSTERECTOMY    . BASAL CELL CARCINOMA EXCISION  12/2017  . BRAIN SURGERY  1998   shunt  . CHOLECYSTECTOMY    . gall bladder    . skin caner    . TUBAL LIGATION    . tubialligation     Social History   Social History Narrative  . Not on file    There is no immunization history on file for this patient.   Objective: Vital Signs: BP (!) 147/89 (BP Location: Left Arm, Patient Position: Sitting, Cuff Size: Normal)   Pulse 78   Ht 5' 0.75" (1.543 m)  Wt 132 lb (59.9 kg)   BMI 25.15 kg/m    Physical Exam Vitals and nursing note reviewed.  Constitutional:      Appearance: She is well-developed and well-nourished.  HENT:     Head: Normocephalic and atraumatic.  Eyes:     Extraocular Movements: EOM normal.     Conjunctiva/sclera: Conjunctivae normal.  Cardiovascular:     Pulses: Intact distal pulses.  Pulmonary:     Effort: Pulmonary effort is normal.  Abdominal:     Palpations: Abdomen is soft.  Musculoskeletal:     Cervical back: Normal range of  motion.  Skin:    General: Skin is warm and dry.     Capillary Refill: Capillary refill takes less than 2 seconds.  Neurological:     Mental Status: She is alert and oriented to person, place, and time.  Psychiatric:        Mood and Affect: Mood and affect normal.        Behavior: Behavior normal.      Musculoskeletal Exam: C-spine, thoracic spine, and lumbar spine good ROM.  Shoulder joints, elbow joints, wrist joints, MCPs, PIPs, and DIPs good ROM with no synovitis.  PIP and DIP thickening consistent with OA of hands. Hip joints, knee joints, and ankle joints good ROM with no discomfort.  Tenderness over the right trochanteric bursa.  No warmth or effusion of knee joints.  No tenderness or swelling of ankle joints.   CDAI Exam: CDAI Score: -- Patient Global: --; Provider Global: -- Swollen: --; Tender: -- Joint Exam 01/16/2020   No joint exam has been documented for this visit   There is currently no information documented on the homunculus. Go to the Rheumatology activity and complete the homunculus joint exam.  Investigation: No additional findings.  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 Imaging was continued during slow intravenous injection of Lasix approximately 15 minutes after the start of the examination. RADIOPHARMACEUTICALS:  5.4 mCi Technetium-20m MAG3 IV COMPARISON:  CT 12/05/2019 FINDINGS: Flow:  Prompt symmetric arterial flow to the kidneys. Left renogram: Normal cortical uptake, excretion and clearance of the radiopharmaceutical. Right renogram: Normal cortical uptake and excretion of the radiopharmaceutical. Delayed clearance from copious right renal pelvis noted. Differential: Left kidney =  45 % Right kidney = 55 % T1/2 post Lasix : Left kidney = 11.2 min Right kidney = 13.6 min IMPRESSION: 1. Normally functioning left kidney. 2. Normal perfusion, cortical uptake and excretion by the right kidney with asymmetric delayed clearance from the copious right renal collecting system. Findings are consistent with low-grade partial obstruction. 3. Split renal function is equal to 45% from the left kidney and 55% from the right kidney. Electronically Signed   By: Signa Kell M.D.   On: 01/04/2020 14:01    Recent Labs: Lab Results  Component Value Date   WBC 11.7 (H) 01/23/2012   HGB 15.2 (H) 01/23/2012   PLT 233 01/23/2012   NA 138 01/23/2012   K 4.0 01/23/2012   CL 99 01/23/2012   CO2 24 01/23/2012   GLUCOSE 137 (H) 01/23/2012   BUN 13 01/23/2012   CREATININE 0.75 01/23/2012   BILITOT 0.6 01/23/2012   ALKPHOS 106 01/23/2012   AST 14 01/23/2012   ALT 15 01/23/2012   PROT 7.7 01/23/2012  ALBUMIN 4.4 01/23/2012   CALCIUM 10.2 01/23/2012   GFRAA >90 01/23/2012    Speciality Comments: No specialty comments available.  Procedures:  No procedures performed Allergies: Other   Assessment / Plan:     Visit Diagnoses: Age-related osteoporosis without current pathological fracture - DEXA on 10/09/19: BMD measured at L FN 0.643 with a T-score of -2.8.  No comparison. Treatment naive.  Discussed DEXA results with the patient today in detail and all questions were addressed. She started taking an OTC vitamin D and calcium supplement several months ago.  She was given a handout with dietary sources of calcium.  Discussed recommended dosing of vitamin D is 2,000 units and calcium 1200 mg daily. Vitamin D level will be checked today.  We also discussed the importance of performing resistive exercises. Different treatment options were discussed today in detail.  Discussed that bisphonates remain the first line treatment option. She is not a good candidate for oral fosamax due to history of  uncontrolled reflux.  She takes protonix 40 mg daily and continues to have breakthrough symptoms of reflux.  Discussed yearly IV reclast as an alternative.  Indications, contraindications, and potential side effects of reclast were discussed. We will obtain the following lab work today.  According to the patient she has not been diagnosed with CKD.  GFR must be >35 to initiate reclast.  We will apply for reclast through her insurance.  She plans on discussing the initiation of reclast with her daughter and getting approval by her urologist prior to starting.  We will notify her with lab results.  She will follow up in 6 months.  - Plan: Parathyroid hormone, intact (no Ca), VITAMIN D 25 Hydroxy (Vit-D Deficiency, Fractures), Phosphorus, Serum protein electrophoresis with reflex, COMPLETE METABOLIC PANEL WITH GFR  Primary osteoarthritis of both hands - U/s of bilateral hands 06/09/2017 negative for synovitis. Bilateral median nerves WNL. XR:PIP, DIP,CMC, and MCP joint narrowing, no erosions RF+, CCP-, 14-3-3 neg: She has not clinical features of rheumatoid arthritis. She has no joint tenderness or synovitis.  She has PIP and DIP thickening consistent with osteoarthritis of both hands.  She is able to make a complete fist bilaterally.   Rheumatoid factor positive: She has no clinical features of rheumatoid arthritis.   Primary osteoarthritis of both feet: She is not experiencing any increased pain in her feet at this time.  Ankle joints have good ROM with no tenderness.   Trochanteric bursitis of right hip: She has tenderness to palpation on exam.  She was encouraged to perform stretching exercises.  She has been to PT in the past.   Chronic SI joint pain - Resolved.   DDD (degenerative disc disease), thoracic: No discomfort.   DDD (degenerative disc disease), lumbar: She experiences intermittent lower back pain and right sided sciatica.    Other medical conditions are listed as follows:   TMJ  (temporomandibular joint disorder)  Family history of psoriasis - Grandfather  Hepatic cyst  Renal cyst  Lung nodule  History of diverticulosis  History of kidney stones  Aortic atherosclerosis ([place])    Orders: Orders Placed This Encounter  Procedures  . Parathyroid hormone, intact (no Ca)  . VITAMIN D 25 Hydroxy (Vit-D Deficiency, Fractures)  . Phosphorus  . Serum protein electrophoresis with reflex  . COMPLETE METABOLIC PANEL WITH GFR   No orders of the defined types were placed in this encounter.    Follow-Up Instructions: Return in about 6 months (around 07/15/2020) for Osteoarthritis, DDD.  Ofilia Neas, PA-C  Note - This record has been created using Dragon software.  Chart creation errors have been sought, but may not always  have been located. Such creation errors do not reflect on  the standard of medical care.

## 2020-01-04 ENCOUNTER — Other Ambulatory Visit: Payer: Self-pay

## 2020-01-04 ENCOUNTER — Ambulatory Visit (HOSPITAL_COMMUNITY)
Admission: RE | Admit: 2020-01-04 | Discharge: 2020-01-04 | Disposition: A | Discharge status: Home / Self Care | Payer: Medicare Other | Source: Ambulatory Visit | Attending: Urology | Admitting: Urology

## 2020-01-04 DIAGNOSIS — N130 Hydronephrosis with ureteropelvic junction obstruction: Secondary | ICD-10-CM | POA: Insufficient documentation

## 2020-01-04 DIAGNOSIS — N135 Crossing vessel and stricture of ureter without hydronephrosis: Secondary | ICD-10-CM | POA: Diagnosis not present

## 2020-01-04 MED ORDER — FUROSEMIDE 10 MG/ML IJ SOLN
INTRAMUSCULAR | Status: AC
  Administered 2020-01-04: 33 mg via INTRAVENOUS
  Filled 2020-01-04: qty 4

## 2020-01-04 MED ORDER — FUROSEMIDE 10 MG/ML IJ SOLN: 33.0000 mg | Freq: Once | INTRAMUSCULAR | Status: AC

## 2020-01-04 MED ORDER — TECHNETIUM TC 99M MERTIATIDE
5.4000 | Freq: Once | INTRAVENOUS | Status: AC
  Administered 2020-01-04: 5.4 via INTRAVENOUS

## 2020-01-16 ENCOUNTER — Other Ambulatory Visit: Payer: Self-pay

## 2020-01-16 ENCOUNTER — Ambulatory Visit (INDEPENDENT_AMBULATORY_CARE_PROVIDER_SITE_OTHER): Payer: Medicare Other | Admitting: Physician Assistant

## 2020-01-16 ENCOUNTER — Encounter: Payer: Self-pay | Admitting: Physician Assistant

## 2020-01-16 VITALS — BP 147/89 | HR 78 | Ht 60.75 in | Wt 132.0 lb

## 2020-01-16 DIAGNOSIS — Z8719 Personal history of other diseases of the digestive system: Secondary | ICD-10-CM

## 2020-01-16 DIAGNOSIS — M7070 Other bursitis of hip, unspecified hip: Secondary | ICD-10-CM

## 2020-01-16 DIAGNOSIS — N281 Cyst of kidney, acquired: Secondary | ICD-10-CM

## 2020-01-16 DIAGNOSIS — R768 Other specified abnormal immunological findings in serum: Secondary | ICD-10-CM | POA: Diagnosis not present

## 2020-01-16 DIAGNOSIS — M533 Sacrococcygeal disorders, not elsewhere classified: Secondary | ICD-10-CM

## 2020-01-16 DIAGNOSIS — I700 Atherosclerosis of aorta: Secondary | ICD-10-CM

## 2020-01-16 DIAGNOSIS — M19041 Primary osteoarthritis, right hand: Secondary | ICD-10-CM | POA: Diagnosis not present

## 2020-01-16 DIAGNOSIS — M5134 Other intervertebral disc degeneration, thoracic region: Secondary | ICD-10-CM

## 2020-01-16 DIAGNOSIS — M19042 Primary osteoarthritis, left hand: Secondary | ICD-10-CM

## 2020-01-16 DIAGNOSIS — M7061 Trochanteric bursitis, right hip: Secondary | ICD-10-CM

## 2020-01-16 DIAGNOSIS — M51369 Other intervertebral disc degeneration, lumbar region without mention of lumbar back pain or lower extremity pain: Secondary | ICD-10-CM

## 2020-01-16 DIAGNOSIS — Z87442 Personal history of urinary calculi: Secondary | ICD-10-CM

## 2020-01-16 DIAGNOSIS — M26609 Unspecified temporomandibular joint disorder, unspecified side: Secondary | ICD-10-CM

## 2020-01-16 DIAGNOSIS — K7689 Other specified diseases of liver: Secondary | ICD-10-CM

## 2020-01-16 DIAGNOSIS — G8929 Other chronic pain: Secondary | ICD-10-CM

## 2020-01-16 DIAGNOSIS — R911 Solitary pulmonary nodule: Secondary | ICD-10-CM

## 2020-01-16 DIAGNOSIS — M19072 Primary osteoarthritis, left ankle and foot: Secondary | ICD-10-CM

## 2020-01-16 DIAGNOSIS — Z840 Family history of diseases of the skin and subcutaneous tissue: Secondary | ICD-10-CM

## 2020-01-16 DIAGNOSIS — M810 Age-related osteoporosis without current pathological fracture: Secondary | ICD-10-CM

## 2020-01-16 DIAGNOSIS — M5136 Other intervertebral disc degeneration, lumbar region: Secondary | ICD-10-CM

## 2020-01-16 DIAGNOSIS — M19071 Primary osteoarthritis, right ankle and foot: Secondary | ICD-10-CM | POA: Diagnosis not present

## 2020-01-16 NOTE — Progress Notes (Signed)
Pharmacy Cu[facility]seling  Counseled patient that Reclast is an IV bisphosphonate that reduces bone turnover by inhibiting osteoclasts that chew up bone.  Counseled patient on purpose, proper use, and adverse effects of Reclast.  Reviewed adverse events of Reclast including risk of nausea & diarrhea, headache, and muscle & bone pain.  Reviewed rare adverse effect of osteonecrosis of the jaw and advised patient to alert her dentist that she is on Reclast prior to any major dental work.  Patient confirms she does not have any major dental work scheduled at this time.  She has jaw locking currently but no pain.  Reviewed importance of taking calcium and vitamin D with bisphosphonate therapy. Recommended daily amount of calcium is 1200mg  and vitamin D [phone] units.  Advised calcium is better obtained through diet vs supplement.  Counseled about risk of excess calcium supplementation such a kidney stones and increased risk of heart disease. She is taking Vitamin D but unsure about units. Not calcium supplementation.  Provided patient with medication education material and answered all questions. She will like to discuss with her daughter and other providers prior to moving forward with Reclast. Baseline lab-work today to check kidney function. She is currently being evaluated for kidney stone by urologist.  Knox Saliva, PharmD, MPH Clinical Pharmacist (Rheumatology and Pulmonology)

## 2020-01-17 ENCOUNTER — Other Ambulatory Visit: Payer: Self-pay | Admitting: *Deleted

## 2020-01-17 ENCOUNTER — Encounter: Payer: Self-pay | Admitting: Rheumatology

## 2020-01-17 DIAGNOSIS — E559 Vitamin D deficiency, unspecified: Secondary | ICD-10-CM

## 2020-01-17 MED ORDER — VITAMIN D (ERGOCALCIFEROL) 1.25 MG (50000 UNIT) PO CAPS: 50000.0000 [IU] | ORAL_CAPSULE | ORAL | 0 refills | Status: DC

## 2020-01-17 NOTE — Progress Notes (Signed)
Vitamin D is low-25. Please notify the patient and send in vitamin D 50,000 units by mouth once weekly x3 months. Recheck vitamin D level in 3 months.

## 2020-01-17 NOTE — Telephone Encounter (Signed)
-----   Message from Ofilia Neas, PA-C sent at 01/17/2020  9:19 AM EST ----- Vitamin D is low-25. Please notify the patient and send in vitamin D 50,000 units by mouth once weekly x3 months. Recheck vitamin D level in 3 months.

## 2020-01-18 DIAGNOSIS — Q6211 Congenital occlusion of ureteropelvic junction: Secondary | ICD-10-CM | POA: Diagnosis not present

## 2020-01-18 LAB — PROTEIN ELECTROPHORESIS, SERUM, WITH REFLEX
Albumin ELP: 4.1 g/dL (ref 3.8–4.8)
Alpha 1: 0.4 g/dL — ABNORMAL HIGH (ref 0.2–0.3)
Alpha 2: 0.7 g/dL (ref 0.5–0.9)
Beta 2: 0.5 g/dL (ref 0.2–0.5)
Beta Globulin: 0.5 g/dL (ref 0.4–0.6)
Gamma Globulin: 0.8 g/dL (ref 0.8–1.7)
Total Protein: 7 g/dL (ref 6.1–8.1)

## 2020-01-18 LAB — COMPLETE METABOLIC PANEL WITH GFR
AG Ratio: 1.8 (calc) (ref 1.0–2.5)
ALT: 14 U/L (ref 6–29)
AST: 11 U/L (ref 10–35)
Albumin: 4.4 g/dL (ref 3.6–5.1)
Alkaline phosphatase (APISO): 94 U/L (ref 37–153)
BUN: 14 mg/dL (ref 7–25)
CO2: 28 mmol/L (ref 20–32)
Calcium: 9.7 mg/dL (ref 8.6–10.4)
Chloride: 104 mmol/L (ref 98–110)
Creat: 0.68 mg/dL (ref 0.60–0.93)
GFR, Est African American: 98 mL/min/{1.73_m2} (ref >=60)
GFR, Est Non African American: 85 mL/min/{1.73_m2} (ref >=60)
Globulin: 2.4 g/dL (calc) (ref 1.9–3.7)
Glucose, Bld: 95 mg/dL (ref 65–99)
Potassium: 4.3 mmol/L (ref 3.5–5.3)
Sodium: 142 mmol/L (ref 135–146)
Total Bilirubin: 0.6 mg/dL (ref 0.2–1.2)
Total Protein: 6.8 g/dL (ref 6.1–8.1)

## 2020-01-18 LAB — PHOSPHORUS: Phosphorus: 4.9 mg/dL — ABNORMAL HIGH (ref 2.1–4.3)

## 2020-01-18 LAB — PARATHYROID HORMONE, INTACT (NO CA): PTH: 39 pg/mL (ref 14–64)

## 2020-01-18 LAB — VITAMIN D 25 HYDROXY (VIT D DEFICIENCY, FRACTURES): Vit D, 25-Hydroxy: 25 ng/mL — ABNORMAL LOW (ref 30–100)

## 2020-01-18 NOTE — Telephone Encounter (Signed)
I reviewed patient's lab.  CMP is normal and GFR is normal.  Phosphorus is mildly elevated.  I am uncertain about the etiology.  She has vitamin D deficiency.  PTH is normal.  SPEP was nonspecific.  Please notify patient's daughter and forward labs to Dr. Claudia Desanctis at [facility] u[facility].  Patient may discuss elevated phosphate with the nephrologist.

## 2020-02-08 DIAGNOSIS — K219 Gastro-esophageal reflux disease without esophagitis: Secondary | ICD-10-CM | POA: Diagnosis not present

## 2020-02-08 DIAGNOSIS — N1330 Unspecified hydronephrosis: Secondary | ICD-10-CM | POA: Diagnosis not present

## 2020-02-08 DIAGNOSIS — E785 Hyperlipidemia, unspecified: Secondary | ICD-10-CM | POA: Diagnosis not present

## 2020-02-13 ENCOUNTER — Encounter: Payer: Self-pay | Admitting: *Deleted

## 2020-02-27 DIAGNOSIS — E1169 Type 2 diabetes mellitus with other specified complication: Secondary | ICD-10-CM | POA: Diagnosis not present

## 2020-02-27 DIAGNOSIS — Z6823 Body mass index (BMI) 23.0-23.9, adult: Secondary | ICD-10-CM | POA: Diagnosis not present

## 2020-02-27 DIAGNOSIS — Z79899 Other long term (current) drug therapy: Secondary | ICD-10-CM | POA: Diagnosis not present

## 2020-02-27 DIAGNOSIS — E559 Vitamin D deficiency, unspecified: Secondary | ICD-10-CM | POA: Diagnosis not present

## 2020-02-27 DIAGNOSIS — E785 Hyperlipidemia, unspecified: Secondary | ICD-10-CM | POA: Diagnosis not present

## 2020-02-27 DIAGNOSIS — N1330 Unspecified hydronephrosis: Secondary | ICD-10-CM | POA: Diagnosis not present

## 2020-04-16 NOTE — Progress Notes (Signed)
Office Visit Note  Patient: Glenda Matthews             Date of Birth: 05-06-1945           MRN: [id number]             PCP: Ernestene Kiel, MD Referring: Ernestene Kiel, MD Visit Date: 04/30/2020 Occupation: @GUAROCC @  Subjective:  Pain in both hands and both hips.   History of Present Illness: Glenda Matthews is a 75 y.o. female with history of osteoporosis and osteoarthritis.  She states she has been taking calcium and vitamin D.  She was recently seen by nephrologist and will be going for a second opinion for some abnormality in her kidney function.  She has been having increased pain and stiffness in her hands.  She also complains of discomfort in the bilateral trochanteric area.  There is no history of joint swelling.  She has decided that she is not going to start on Reclast for osteoporosis.  Activities of Daily Living:  Patient reports morning stiffness for several minutes.   Patient Reports nocturnal pain.  Difficulty dressing/grooming: Denies Difficulty climbing stairs: Denies Difficulty getting out of chair: Denies Difficulty using hands for taps, buttons, cutlery, and/or writing: Reports  Review of Systems  Constitutional: Positive for fatigue.  HENT: Positive for mouth dryness. Negative for mouth sores and nose dryness.   Eyes: Negative for pain, itching and dryness.  Respiratory: Negative for shortness of breath and difficulty breathing.   Cardiovascular: Negative for chest pain and palpitations.  Gastrointestinal: Positive for constipation. Negative for blood in stool and diarrhea.  Endocrine: Negative for increased urination.  Genitourinary: Negative for difficulty urinating.  Musculoskeletal: Positive for arthralgias, joint pain, myalgias, morning stiffness and myalgias. Negative for joint swelling and muscle tenderness.  Skin: Negative for color change, rash and redness.  Allergic/Immunologic: Negative for susceptible to infections.  Neurological:  Positive for numbness. Negative for dizziness, headaches, memory loss and weakness.  Hematological: Positive for bruising/bleeding tendency.  Psychiatric/Behavioral: Negative for confusion.    PMFS History:  Patient Active Problem List   Diagnosis Date Noted  . Age-related osteoporosis without current pathological fracture 04/30/2020  . Primary osteoarthritis of both hands 04/30/2020  . Primary osteoarthritis of both feet 04/30/2020  . Chronic SI joint pain 04/30/2020  . DDD (degenerative disc disease), thoracic 04/30/2020  . DDD (degenerative disc disease), lumbar 04/30/2020  . Hepatic cyst 04/30/2020  . Renal cyst 04/30/2020  . History of diverticulosis 04/30/2020  . History of kidney stones 04/30/2020  . Aortic atherosclerosis ([place]) 04/30/2020  . Lung nodule 04/30/2020    Past Medical History:  Diagnosis Date  . Anemia    none in recent years  . Arthritis    shoulders  . Chronic kidney disease    kidney stone  . Diverticulosis   . Headache(784.0)   . Liver cyst   . Osteoarthritis   . Sciatica     Family History  Problem Relation Age of Onset  . Cancer Mother    Past Surgical History:  Procedure Laterality Date  . ABDOMINAL HYSTERECTOMY    . BASAL CELL CARCINOMA EXCISION  12/2017  . BRAIN SURGERY  1998   shunt  . CHOLECYSTECTOMY    . gall bladder    . skin caner    . TUBAL LIGATION    . tubialligation     Social History   Social History Narrative  . Not on file    There is no immunization history  on file for this patient.   Objective: Vital Signs: BP (!) 153/93 (BP Location: Left Arm, Patient Position: Sitting, Cuff Size: Normal)   Pulse 86   Resp 14   Ht 5' 0.75" (1.543 m)   Wt 126 lb 6.4 oz (57.3 kg)   BMI 24.08 kg/m    Physical Exam Vitals and nursing note reviewed.  Constitutional:      Appearance: She is well-developed.  HENT:     Head: Normocephalic and atraumatic.  Eyes:     Conjunctiva/sclera: Conjunctivae normal.  Cardiovascular:      Rate and Rhythm: Normal rate and regular rhythm.     Heart sounds: Normal heart sounds.  Pulmonary:     Effort: Pulmonary effort is normal.     Breath sounds: Normal breath sounds.  Abdominal:     General: Bowel sounds are normal.     Palpations: Abdomen is soft.  Musculoskeletal:     Cervical back: Normal range of motion.  Lymphadenopathy:     Cervical: No cervical adenopathy.  Skin:    General: Skin is warm and dry.     Capillary Refill: Capillary refill takes less than 2 seconds.  Neurological:     Mental Status: She is alert and oriented to person, place, and time.  Psychiatric:        Behavior: Behavior normal.      Musculoskeletal Exam: C-spine was in good range of motion.  Shoulder joints, elbow joints, wrist joints with good range of motion.  She had bilateral PIP and DIP thickening with no synovitis.  Hip joints were in good range of motion.  She had tenderness on palpation of bilateral trochanteric area.  She had no discomfort or inflammation on palpation of her knee joints.  There was no tenderness over ankles or MTPs.  CDAI Exam: CDAI Score: -- Patient Global: --; Provider Global: -- Swollen: --; Tender: -- Joint Exam 04/30/2020   No joint exam has been documented for this visit   There is currently no information documented on the homunculus. Go to the Rheumatology activity and complete the homunculus joint exam.  Investigation: No additional findings.  Imaging: No results found.  Recent Labs: Lab Results  Component Value Date   WBC 11.7 (H) 01/23/2012   HGB 15.2 (H) 01/23/2012   PLT 233 01/23/2012   NA 142 01/16/2020   K 4.3 01/16/2020   CL 104 01/16/2020   CO2 28 01/16/2020   GLUCOSE 95 01/16/2020   BUN 14 01/16/2020   CREATININE 0.68 01/16/2020   BILITOT 0.6 01/16/2020   ALKPHOS 106 01/23/2012   AST 11 01/16/2020   ALT 14 01/16/2020   PROT 7.0 01/16/2020   PROT 6.8 01/16/2020   ALBUMIN 4.4 01/23/2012   CALCIUM 9.7 01/16/2020   GFRAA 98  01/16/2020    Speciality Comments: No specialty comments available.  Procedures:  No procedures performed Allergies: Other   Assessment / Plan:     Visit Diagnoses: Age-related osteoporosis without current pathological fracture - DEXA on 10/09/19: BMD measured at L FN 0.643 with a T-score of -2.8.  No comparison. Treatment naive. Discussed reclast at the last office visit.  She does not want to start on Reclast at this point.  Primary osteoarthritis of both hands - U/s of bilateral hands 06/09/2017 negative for synovitis. Bilateral median nerves WNL.  She continues to have pain and discomfort in her bilateral hands.  Joint protection muscle strengthening was discussed.  Have also given her a handout on hand exercises.  Rheumatoid factor positive - She has no clinical features of rheumatoid arthritis.  She had no synovitis on examination.  Primary osteoarthritis of both feet-proper fitting shoes were discussed.  Trochanteric bursitis of right hip-she has been having discomfort in the bilateral trochanteric area more prominent on the right side.  Have given her a handout on IT band stretches.  DDD (degenerative disc disease), thoracic-doing better.  DDD (degenerative disc disease), lumbar-she has chronic discomfort which is manageable.  Family history of psoriasis - Grandfather  Hepatic cyst  Renal cyst  History of diverticulosis  History of kidney stones-patient states that she has been seeing a nephrologist for some abnormality in the kidney function.  I do not have any records available.  Aortic atherosclerosis ([facility])  Lung nodule  Vitamin D deficiency -her vitamin D was low.  She took vitamin D 50,000 units once a week for 3 months.  We will check vitamin D.  She will need maintenance dose once the results are available.  Plan: VITAMIN D 25 Hydroxy (Vit-D Deficiency, Fractures)  Orders: Orders Placed This Encounter  Procedures  . VITAMIN D 25 Hydroxy (Vit-D Deficiency,  Fractures)   No orders of the defined types were placed in this encounter.    Follow-Up Instructions: Return in about 6 months (around 10/30/2020) for Osteoporosis, Osteoarthritis.   Bo Merino, MD  Note - This record has been created using Editor, commissioning.  Chart creation errors have been sought, but may not always  have been located. Such creation errors do not reflect on  the standard of medical care.

## 2020-04-30 ENCOUNTER — Encounter: Payer: Self-pay | Admitting: Rheumatology

## 2020-04-30 ENCOUNTER — Ambulatory Visit (INDEPENDENT_AMBULATORY_CARE_PROVIDER_SITE_OTHER): Payer: Medicare Other | Admitting: Rheumatology

## 2020-04-30 ENCOUNTER — Other Ambulatory Visit: Payer: Self-pay

## 2020-04-30 VITALS — BP 153/93 | HR 86 | Resp 14 | Ht 60.75 in | Wt 126.4 lb

## 2020-04-30 DIAGNOSIS — R911 Solitary pulmonary nodule: Secondary | ICD-10-CM | POA: Insufficient documentation

## 2020-04-30 DIAGNOSIS — R768 Other specified abnormal immunological findings in serum: Secondary | ICD-10-CM

## 2020-04-30 DIAGNOSIS — Z840 Family history of diseases of the skin and subcutaneous tissue: Secondary | ICD-10-CM | POA: Diagnosis not present

## 2020-04-30 DIAGNOSIS — Z87442 Personal history of urinary calculi: Secondary | ICD-10-CM | POA: Diagnosis not present

## 2020-04-30 DIAGNOSIS — K7689 Other specified diseases of liver: Secondary | ICD-10-CM

## 2020-04-30 DIAGNOSIS — M7061 Trochanteric bursitis, right hip: Secondary | ICD-10-CM | POA: Diagnosis not present

## 2020-04-30 DIAGNOSIS — M19041 Primary osteoarthritis, right hand: Secondary | ICD-10-CM | POA: Diagnosis not present

## 2020-04-30 DIAGNOSIS — M5136 Other intervertebral disc degeneration, lumbar region: Secondary | ICD-10-CM

## 2020-04-30 DIAGNOSIS — Z8719 Personal history of other diseases of the digestive system: Secondary | ICD-10-CM

## 2020-04-30 DIAGNOSIS — N281 Cyst of kidney, acquired: Secondary | ICD-10-CM | POA: Diagnosis not present

## 2020-04-30 DIAGNOSIS — M810 Age-related osteoporosis without current pathological fracture: Secondary | ICD-10-CM

## 2020-04-30 DIAGNOSIS — M19071 Primary osteoarthritis, right ankle and foot: Secondary | ICD-10-CM | POA: Diagnosis not present

## 2020-04-30 DIAGNOSIS — M51369 Other intervertebral disc degeneration, lumbar region without mention of lumbar back pain or lower extremity pain: Secondary | ICD-10-CM

## 2020-04-30 DIAGNOSIS — I700 Atherosclerosis of aorta: Secondary | ICD-10-CM | POA: Insufficient documentation

## 2020-04-30 DIAGNOSIS — M19042 Primary osteoarthritis, left hand: Secondary | ICD-10-CM

## 2020-04-30 DIAGNOSIS — M26609 Unspecified temporomandibular joint disorder, unspecified side: Secondary | ICD-10-CM

## 2020-04-30 DIAGNOSIS — M19072 Primary osteoarthritis, left ankle and foot: Secondary | ICD-10-CM

## 2020-04-30 DIAGNOSIS — G8929 Other chronic pain: Secondary | ICD-10-CM | POA: Insufficient documentation

## 2020-04-30 DIAGNOSIS — M5134 Other intervertebral disc degeneration, thoracic region: Secondary | ICD-10-CM | POA: Diagnosis not present

## 2020-04-30 DIAGNOSIS — E559 Vitamin D deficiency, unspecified: Secondary | ICD-10-CM

## 2020-04-30 LAB — VITAMIN D 25 HYDROXY (VIT D DEFICIENCY, FRACTURES): Vit D, 25-Hydroxy: 58 ng/mL (ref 30–100)

## 2020-04-30 NOTE — Patient Instructions (Signed)
Iliotibial Band Syndrome Rehab Ask your health care provider which exercises are safe for you. Do exercises exactly as told by your health care provider and adjust them as directed. It is normal to feel mild stretching, pulling, tightness, or discomfort as you do these exercises. Stop right away if you feel sudden pain or your pain gets significantly worse. Do not begin these exercises until told by your health care provider. Stretching and range-of-motion exercises These exercises warm up your muscles and joints and improve the movement and flexibility of your hip and pelvis. Quadriceps stretch, prone 1. Lie on your abdomen (prone position) on a firm surface, such as a bed or padded floor. 2. Bend your left / right knee and reach back to hold your ankle or pant leg. If you cannot reach your ankle or pant leg, loop a belt around your foot and grab the belt instead. 3. Gently pull your heel toward your buttocks. Your knee should not slide out to the side. You should feel a stretch in the front of your thigh and knee (quadriceps). 4. Hold this position for __________ seconds. Repeat __________ times. Complete this exercise __________ times a day.   Iliotibial band stretch An iliotibial band is a strong band of muscle tissue that runs from the outer side of your hip to the outer side of your thigh and knee. 1. Lie on your side with your left / right leg in the top position. 2. Bend both of your knees and grab your left / right ankle. Stretch out your bottom arm to help you balance. 3. Slowly bring your top knee back so your thigh goes behind your trunk. 4. Slowly lower your top leg toward the floor until you feel a gentle stretch on the outside of your left / right hip and thigh. If you do not feel a stretch and your knee will not fall farther, place the heel of your other foot on top of your knee and pull your knee down toward the floor with your foot. 5. Hold this position for __________  seconds. Repeat __________ times. Complete this exercise __________ times a day.   Strengthening exercises These exercises build strength and endurance in your hip and pelvis. Endurance is the ability to use your muscles for a long time, even after they get tired. Straight leg raises, side-lying This exercise strengthens the muscles that rotate the leg at the hip and move it away from your body (hip abductors). 1. Lie on your side with your left / right leg in the top position. Lie so your head, shoulder, hip, and knee line up. You may bend your bottom knee to help you balance. 2. Roll your hips slightly forward so your hips are stacked directly over each other and your left / right knee is facing forward. 3. Tense the muscles in your outer thigh and lift your top leg 4-6 inches (10-15 cm). 4. Hold this position for __________ seconds. 5. Slowly lower your leg to return to the starting position. Let your muscles relax completely before doing another repetition. Repeat __________ times. Complete this exercise __________ times a day.   Leg raises, prone This exercise strengthens the muscles that move the hips backward (hip extensors). 1. Lie on your abdomen (prone position) on your bed or a firm surface. You can put a pillow under your hips if that is more comfortable for your lower back. 2. Bend your left / right knee so your foot is straight up in the air.   3. Squeeze your buttocks muscles and lift your left / right thigh off the bed. Do not let your back arch. 4. Tense your thigh muscle as hard as you can without increasing any knee pain. 5. Hold this position for __________ seconds. 6. Slowly lower your leg to return to the starting position and allow it to relax completely. Repeat __________ times. Complete this exercise __________ times a day. Hip hike 1. Stand sideways on a bottom step. Stand on your left / right leg with your other foot unsupported next to the step. You can hold on to a  railing or wall for balance if needed. 2. Keep your knees straight and your torso square. Then lift your left / right hip up toward the ceiling. 3. Slowly let your left / right hip lower toward the floor, past the starting position. Your foot should get closer to the floor. Do not lean or bend your knees. Repeat __________ times. Complete this exercise __________ times a day. This information is not intended to replace advice given to you by your health care provider. Make sure you discuss any questions you have with your health care provider. Document Revised: 03/01/2019 Document Reviewed: 03/01/2019 Elsevier Patient Education  2021 [employer] Hand Exercises Hand exercises can be helpful for almost anyone. These exercises can strengthen the hands, improve flexibility and movement, and increase blood flow to the hands. These results can make work and daily tasks easier. Hand exercises can be especially helpful for people who have joint pain from arthritis or have nerve damage from overuse (carpal tunnel syndrome). These exercises can also help people who have injured a hand. Exercises Most of these hand exercises are gentle stretching and motion exercises. It is usually safe to do them often throughout the day. Warming up your hands before exercise may help to reduce stiffness. You can do this with gentle massage or by placing your hands in warm water for 10-15 minutes. It is normal to feel some stretching, pulling, tightness, or mild discomfort as you begin new exercises. This will gradually improve. Stop an exercise right away if you feel sudden, severe pain or your pain gets worse. Ask your health care provider which exercises are best for you. Knuckle bend or "claw" fist 1. Stand or sit with your arm, hand, and all five fingers pointed straight up. Make sure to keep your wrist straight during the exercise. 2. Gently bend your fingers down toward your palm until the tips of your fingers are  touching the top of your palm. Keep your big knuckle straight and just bend the small knuckles in your fingers. 3. Hold this position for __________ seconds. 4. Straighten (extend) your fingers back to the starting position. Repeat this exercise 5-10 times with each hand. Full finger fist 1. Stand or sit with your arm, hand, and all five fingers pointed straight up. Make sure to keep your wrist straight during the exercise. 2. Gently bend your fingers into your palm until the tips of your fingers are touching the middle of your palm. 3. Hold this position for __________ seconds. 4. Extend your fingers back to the starting position, stretching every joint fully. Repeat this exercise 5-10 times with each hand. Straight fist 1. Stand or sit with your arm, hand, and all five fingers pointed straight up. Make sure to keep your wrist straight during the exercise. 2. Gently bend your fingers at the big knuckle, where your fingers meet your hand, and the middle knuckle. Keep the knuckle   at the tips of your fingers straight and try to touch the bottom of your palm. 3. Hold this position for __________ seconds. 4. Extend your fingers back to the starting position, stretching every joint fully. Repeat this exercise 5-10 times with each hand. Tabletop 1. Stand or sit with your arm, hand, and all five fingers pointed straight up. Make sure to keep your wrist straight during the exercise. 2. Gently bend your fingers at the big knuckle, where your fingers meet your hand, as far down as you can while keeping the small knuckles in your fingers straight. Think of forming a tabletop with your fingers. 3. Hold this position for __________ seconds. 4. Extend your fingers back to the starting position, stretching every joint fully. Repeat this exercise 5-10 times with each hand. Finger spread 1. Place your hand flat on a table with your palm facing down. Make sure your wrist stays straight as you do this  exercise. 2. Spread your fingers and thumb apart from each other as far as you can until you feel a gentle stretch. Hold this position for __________ seconds. 3. Bring your fingers and thumb tight together again. Hold this position for __________ seconds. Repeat this exercise 5-10 times with each hand. Making circles 1. Stand or sit with your arm, hand, and all five fingers pointed straight up. Make sure to keep your wrist straight during the exercise. 2. Make a circle by touching the tip of your thumb to the tip of your index finger. 3. Hold for __________ seconds. Then open your hand wide. 4. Repeat this motion with your thumb and each finger on your hand. Repeat this exercise 5-10 times with each hand. Thumb motion 1. Sit with your forearm resting on a table and your wrist straight. Your thumb should be facing up toward the ceiling. Keep your fingers relaxed as you move your thumb. 2. Lift your thumb up as high as you can toward the ceiling. Hold for __________ seconds. 3. Bend your thumb across your palm as far as you can, reaching the tip of your thumb for the small finger (pinkie) side of your palm. Hold for __________ seconds. Repeat this exercise 5-10 times with each hand. Grip strengthening 1. Hold a stress ball or other soft ball in the middle of your hand. 2. Slowly increase the pressure, squeezing the ball as much as you can without causing pain. Think of bringing the tips of your fingers into the middle of your palm. All of your finger joints should bend when doing this exercise. 3. Hold your squeeze for __________ seconds, then relax. Repeat this exercise 5-10 times with each hand.   Contact a health care provider if:  Your hand pain or discomfort gets much worse when you do an exercise.  Your hand pain or discomfort does not improve within 2 hours after you exercise. If you have any of these problems, stop doing these exercises right away. Do not do them again unless your  health care provider says that you can. Get help right away if:  You develop sudden, severe hand pain or swelling. If this happens, stop doing these exercises right away. Do not do them again unless your health care provider says that you can. This information is not intended to replace advice given to you by your health care provider. Make sure you discuss any questions you have with your health care provider. Document Revised: 04/14/2018 Document Reviewed: 12/23/2017 Elsevier Patient Education  2021 [employer] Inc[employer]  

## 2020-05-08 DIAGNOSIS — H903 Sensorineural hearing loss, bilateral: Secondary | ICD-10-CM | POA: Diagnosis not present

## 2020-05-08 DIAGNOSIS — H9313 Tinnitus, bilateral: Secondary | ICD-10-CM | POA: Diagnosis not present

## 2020-05-24 DIAGNOSIS — K5900 Constipation, unspecified: Secondary | ICD-10-CM | POA: Diagnosis not present

## 2020-05-24 DIAGNOSIS — R3915 Urgency of urination: Secondary | ICD-10-CM | POA: Diagnosis not present

## 2020-07-22 DIAGNOSIS — Z79899 Other long term (current) drug therapy: Secondary | ICD-10-CM | POA: Diagnosis not present

## 2020-07-22 DIAGNOSIS — H9313 Tinnitus, bilateral: Secondary | ICD-10-CM | POA: Diagnosis not present

## 2020-07-22 DIAGNOSIS — N1330 Unspecified hydronephrosis: Secondary | ICD-10-CM | POA: Diagnosis not present

## 2020-07-22 DIAGNOSIS — Z982 Presence of cerebrospinal fluid drainage device: Secondary | ICD-10-CM | POA: Diagnosis not present

## 2020-07-22 DIAGNOSIS — E785 Hyperlipidemia, unspecified: Secondary | ICD-10-CM | POA: Diagnosis not present

## 2020-07-22 DIAGNOSIS — E1169 Type 2 diabetes mellitus with other specified complication: Secondary | ICD-10-CM | POA: Diagnosis not present

## 2020-07-25 DIAGNOSIS — H52223 Regular astigmatism, bilateral: Secondary | ICD-10-CM | POA: Diagnosis not present

## 2020-07-25 DIAGNOSIS — E119 Type 2 diabetes mellitus without complications: Secondary | ICD-10-CM | POA: Diagnosis not present

## 2020-07-25 DIAGNOSIS — H524 Presbyopia: Secondary | ICD-10-CM | POA: Diagnosis not present

## 2020-07-25 DIAGNOSIS — H5203 Hypermetropia, bilateral: Secondary | ICD-10-CM | POA: Diagnosis not present

## 2020-07-25 DIAGNOSIS — H18513 Endothelial corneal dystrophy, bilateral: Secondary | ICD-10-CM | POA: Diagnosis not present

## 2020-08-01 DIAGNOSIS — I6523 Occlusion and stenosis of bilateral carotid arteries: Secondary | ICD-10-CM | POA: Diagnosis not present

## 2020-08-01 DIAGNOSIS — H9313 Tinnitus, bilateral: Secondary | ICD-10-CM | POA: Diagnosis not present

## 2020-08-01 DIAGNOSIS — Z982 Presence of cerebrospinal fluid drainage device: Secondary | ICD-10-CM | POA: Diagnosis not present

## 2020-09-30 DIAGNOSIS — E785 Hyperlipidemia, unspecified: Secondary | ICD-10-CM | POA: Diagnosis not present

## 2020-09-30 DIAGNOSIS — Z1231 Encounter for screening mammogram for malignant neoplasm of breast: Secondary | ICD-10-CM | POA: Diagnosis not present

## 2020-09-30 DIAGNOSIS — Z1331 Encounter for screening for depression: Secondary | ICD-10-CM | POA: Diagnosis not present

## 2020-09-30 DIAGNOSIS — I700 Atherosclerosis of aorta: Secondary | ICD-10-CM | POA: Diagnosis not present

## 2020-09-30 DIAGNOSIS — E1169 Type 2 diabetes mellitus with other specified complication: Secondary | ICD-10-CM | POA: Diagnosis not present

## 2020-09-30 DIAGNOSIS — Z0000 Encounter for general adult medical examination without abnormal findings: Secondary | ICD-10-CM | POA: Diagnosis not present

## 2020-10-15 NOTE — Progress Notes (Signed)
Office Visit Note  Patient: Glenda Matthews             Date of Birth: 1945/05/31           MRN: [id number]             PCP: Ernestene Kiel, MD Referring: Ernestene Kiel, MD Visit Date: 10/29/2020 Occupation: @GUAROCC @  Subjective:  Left shoulder pain   History of Present Illness: [name] is a 75 y.o. female with history of osteoporosis and osteoarthritis.  The patient is currently not on therapy for osteoporosis management.  She has been taking vitamin D 1000 units daily.  She denies any recent falls or fractures. She reports that 2 weeks ago she decided to wash her car and since then has been experiencing increased discomfort in the left shoulder.  She sleeps on her left side at night which she feels has been exacerbating her discomfort.  She denies any muscle spasms.  She describes the pain as an aching sensation.  She has been taking Advil as needed which has provided minimal relief.  Patient reports that she continues to experience trochanter bursitis of both hips intermittently.  She denies any other joint pain or joint swelling at this time.   Activities of Daily Living:  Patient reports morning stiffness for 5 minutes.   Patient Reports nocturnal pain.  Difficulty dressing/grooming: Denies Difficulty climbing stairs: Reports Difficulty getting out of chair: Reports Difficulty using hands for taps, buttons, cutlery, and/or writing: Reports  Review of Systems  Constitutional:  Positive for fatigue.  HENT:  Negative for mouth sores, mouth dryness and nose dryness.   Eyes:  Negative for pain, visual disturbance and dryness.  Respiratory:  Negative for cough, hemoptysis, shortness of breath and difficulty breathing.   Cardiovascular:  Negative for chest pain, palpitations, hypertension and swelling in legs/feet.  Gastrointestinal:  Negative for blood in stool, constipation and diarrhea.  Endocrine: Negative for increased urination.  Genitourinary:  Negative for  difficulty urinating and painful urination.  Musculoskeletal:  Positive for joint pain, joint pain, muscle weakness and morning stiffness. Negative for joint swelling, myalgias, muscle tenderness and myalgias.  Skin:  Negative for color change, pallor, rash, hair loss, nodules/bumps, skin tightness, ulcers and sensitivity to sunlight.  Allergic/Immunologic: Negative for susceptible to infections.  Neurological:  Positive for weakness. Negative for dizziness, numbness and headaches.  Hematological:  Positive for bruising/bleeding tendency. Negative for swollen glands.  Psychiatric/Behavioral:  Positive for sleep disturbance. Negative for depressed mood. The patient is not nervous/anxious.    PMFS History:  Patient Active Problem List   Diagnosis Date Noted   Age-related osteoporosis without current pathological fracture 04/30/2020   Primary osteoarthritis of both hands 04/30/2020   Primary osteoarthritis of both feet 04/30/2020   Chronic SI joint pain 04/30/2020   DDD (degenerative disc disease), thoracic 04/30/2020   DDD (degenerative disc disease), lumbar 04/30/2020   Hepatic cyst 04/30/2020   Renal cyst 04/30/2020   History of diverticulosis 04/30/2020   History of kidney stones 04/30/2020   Aortic atherosclerosis ([place]) 04/30/2020   Lung nodule 04/30/2020    Past Medical History:  Diagnosis Date   Anemia    none in recent years   Arthritis    shoulders   Chronic kidney disease    kidney stone   Diverticulosis    Headache(784.0)    Liver cyst    Osteoarthritis    Sciatica     Family History  Problem Relation Age of Onset  Cancer Mother    Past Surgical History:  Procedure Laterality Date   ABDOMINAL HYSTERECTOMY     BASAL CELL CARCINOMA EXCISION  12/2017   BRAIN SURGERY  1998   shunt   CHOLECYSTECTOMY     gall bladder     skin caner     TUBAL LIGATION     tubialligation     Social History   Social History Narrative   Not on file    There is no  immunization history on file for this patient.   Objective: Vital Signs: BP (!) 151/79 (BP Location: Left Arm, Patient Position: Sitting, Cuff Size: Normal)   Pulse 89   Resp 16   Ht 5\' 1"  (1.549 m)   Wt 120 lb (54.4 kg)   BMI 22.67 kg/m    Physical Exam Vitals and nursing note reviewed.  Constitutional:      Appearance: She is well-developed.  HENT:     Head: Normocephalic and atraumatic.  Eyes:     Conjunctiva/sclera: Conjunctivae normal.  Pulmonary:     Effort: Pulmonary effort is normal.  Abdominal:     Palpations: Abdomen is soft.  Musculoskeletal:     Cervical back: Normal range of motion.  Skin:    General: Skin is warm and dry.     Capillary Refill: Capillary refill takes less than 2 seconds.  Neurological:     Mental Status: She is alert and oriented to person, place, and time.  Psychiatric:        Behavior: Behavior normal.     Musculoskeletal Exam: C-spine has limited range of motion with lateral rotation.  No midline spinal tenderness or SI joint tenderness.  Good range of motion of both shoulder joints with some discomfort in the left shoulder.  Some left trapezius muscle tension and tenderness noted.  Elbow joints, wrist joints, MCPs, PIPs, DIPs have good range of motion with no synovitis.  Complete fist formation bilaterally.  Hip joints have good range of motion with no discomfort.  Minimal tenderness over bilateral trochanteric bursa noted.  Knee joints have good range of motion with no warmth or effusion.  Ankle joints have good range of motion with no tenderness or joint swelling.  CDAI Exam: CDAI Score: -- Patient Global: --; Provider Global: -- Swollen: --; Tender: -- Joint Exam 10/29/2020   No joint exam has been documented for this visit   There is currently no information documented on the homunculus. Go to the Rheumatology activity and complete the homunculus joint exam.  Investigation: No additional findings.  Imaging: No results  found.  Recent Labs: Lab Results  Component Value Date   WBC 11.7 (H) 01/23/2012   HGB 15.2 (H) 01/23/2012   PLT 233 01/23/2012   NA 142 01/16/2020   K 4.3 01/16/2020   CL 104 01/16/2020   CO2 28 01/16/2020   GLUCOSE 95 01/16/2020   BUN 14 01/16/2020   CREATININE 0.68 01/16/2020   BILITOT 0.6 01/16/2020   ALKPHOS 106 01/23/2012   AST 11 01/16/2020   ALT 14 01/16/2020   PROT 7.0 01/16/2020   PROT 6.8 01/16/2020   ALBUMIN 4.4 01/23/2012   CALCIUM 9.7 01/16/2020   GFRAA 98 01/16/2020    Speciality Comments: No specialty comments available.  Procedures:  No procedures performed Allergies: Other   Assessment / Plan:     Visit Diagnoses: Age-related osteoporosis without current pathological fracture - DEXA on 10/09/19: BMD measured at L FN 0.643 with a T-score of -2.8.  No comparison.  Treatment nave.  DEXA results were first discussed with the patient on 01/16/2020.  At that time an in-depth review of medication options were discussed.  The plan was to proceed with Reclast.  On 04/30/2020 she followed up with Dr. Estanislado Pandy at which time she declined initiating Reclast.  I discussed proceeding with Reclast at this time but she would like to further discuss with her daughter prior to making any decisions. Discussed the risks of untreated osteoporosis.  She plans on continuing to take vitamin D 1000 units daily. A handout about Reclast was provided to the patient to further review.  She was advised to notify us if she changes her mind and would like to proceed with Reclast in the future.  Vitamin D deficiency: She is taking vitamin D 1000 units daily.  Her vitamin D was 58 on 04/30/2020.  Primary osteoarthritis of both hands - U/s of bilateral hands 06/09/2017 negative for synovitis. Bilateral median nerves WNL.  No tenderness or inflammation was noted on examination today.  She was able to make a complete fist bilaterally.  Discussed the importance of joint protection and muscle  strengthening.  Rheumatoid factor positive: She has no clinical features of rheumatoid arthritis at this time.  No joint tenderness or synovitis was noted on examination today.  Primary osteoarthritis of both feet: She is not experiencing any increased discomfort in her feet at this time.  Trochanteric bursitis of both hips: She experiences intermittent discomfort due to trochanter bursitis of both hips  she is a side sleeper which exacerbates her symptoms.  DDD (degenerative disc disease), thoracic: No midline spinal tenderness.  DDD (degenerative disc disease), lumbar: No midline spinal tenderness or SI joint tenderness noted.  She experiences occasional right-sided sciatica but is not experiencing any symptoms currently.  Acute pain of left shoulder: She presents today with discomfort in the left shoulder which started 2 weeks ago.  According to the patient she decided to wash her car and since then has had increased discomfort in the left shoulder.  She sleeps on her left side at night which has been exacerbating her discomfort.  She tried taking Advil at bedtime as needed with minimal relief.  She has not had any chest pain, diaphoresis, or shortness of breath.  On examination today she has good range of motion of the left shoulder with some discomfort especially with internal rotation.  She has tenderness over the left trapezius muscle.  She declined x-rays of the left shoulder today.  She declined a referral to orthopedics.  She was given a handout of shoulder exercises to perform.  She plans on continuing to use a heating pad and will try over-the-counter topical agents for pain relief.  She was advised to notify us if her discomfort persists or worsens at which time we can order further imaging or refer her to Ortho.  Other medical conditions are listed as follows:   Family history of psoriasis - Grandfather  Hepatic cyst  Renal cyst  History of diverticulosis  Aortic  atherosclerosis ([place])  History of kidney stones  Lung nodule    Orders: No orders of the defined types were placed in this encounter.  No orders of the defined types were placed in this encounter.     Follow-Up Instructions: Return in about 6 months (around 04/29/2021) for Osteoarthritis, Osteoporosis.   Ofilia Neas, PA-C  Note - This record has been created using Dragon software.  Chart creation errors have been sought, but may not always  have been located. Such creation errors do not reflect on  the standard of medical care.

## 2020-10-22 DIAGNOSIS — Z1231 Encounter for screening mammogram for malignant neoplasm of breast: Secondary | ICD-10-CM | POA: Diagnosis not present

## 2020-10-29 ENCOUNTER — Encounter: Payer: Self-pay | Admitting: Physician Assistant

## 2020-10-29 ENCOUNTER — Other Ambulatory Visit: Payer: Self-pay

## 2020-10-29 ENCOUNTER — Ambulatory Visit (INDEPENDENT_AMBULATORY_CARE_PROVIDER_SITE_OTHER): Payer: Medicare Other | Admitting: Physician Assistant

## 2020-10-29 VITALS — BP 151/79 | HR 89 | Resp 16 | Ht 61.0 in | Wt 120.0 lb

## 2020-10-29 DIAGNOSIS — R768 Other specified abnormal immunological findings in serum: Secondary | ICD-10-CM | POA: Diagnosis not present

## 2020-10-29 DIAGNOSIS — M7061 Trochanteric bursitis, right hip: Secondary | ICD-10-CM

## 2020-10-29 DIAGNOSIS — K7689 Other specified diseases of liver: Secondary | ICD-10-CM

## 2020-10-29 DIAGNOSIS — M7062 Trochanteric bursitis, left hip: Secondary | ICD-10-CM

## 2020-10-29 DIAGNOSIS — M19071 Primary osteoarthritis, right ankle and foot: Secondary | ICD-10-CM

## 2020-10-29 DIAGNOSIS — M810 Age-related osteoporosis without current pathological fracture: Secondary | ICD-10-CM | POA: Diagnosis not present

## 2020-10-29 DIAGNOSIS — M19041 Primary osteoarthritis, right hand: Secondary | ICD-10-CM | POA: Diagnosis not present

## 2020-10-29 DIAGNOSIS — M5134 Other intervertebral disc degeneration, thoracic region: Secondary | ICD-10-CM | POA: Diagnosis not present

## 2020-10-29 DIAGNOSIS — Z840 Family history of diseases of the skin and subcutaneous tissue: Secondary | ICD-10-CM | POA: Diagnosis not present

## 2020-10-29 DIAGNOSIS — M25512 Pain in left shoulder: Secondary | ICD-10-CM

## 2020-10-29 DIAGNOSIS — M19042 Primary osteoarthritis, left hand: Secondary | ICD-10-CM

## 2020-10-29 DIAGNOSIS — E559 Vitamin D deficiency, unspecified: Secondary | ICD-10-CM

## 2020-10-29 DIAGNOSIS — I700 Atherosclerosis of aorta: Secondary | ICD-10-CM | POA: Diagnosis not present

## 2020-10-29 DIAGNOSIS — R911 Solitary pulmonary nodule: Secondary | ICD-10-CM

## 2020-10-29 DIAGNOSIS — M5136 Other intervertebral disc degeneration, lumbar region: Secondary | ICD-10-CM

## 2020-10-29 DIAGNOSIS — Z8719 Personal history of other diseases of the digestive system: Secondary | ICD-10-CM | POA: Diagnosis not present

## 2020-10-29 DIAGNOSIS — Z87442 Personal history of urinary calculi: Secondary | ICD-10-CM

## 2020-10-29 DIAGNOSIS — N281 Cyst of kidney, acquired: Secondary | ICD-10-CM

## 2020-10-29 DIAGNOSIS — M19072 Primary osteoarthritis, left ankle and foot: Secondary | ICD-10-CM

## 2020-10-29 NOTE — Patient Instructions (Signed)
Shoulder Exercises Ask your health care provider which exercises are safe for you. Do exercises exactly as told by your health care provider and adjust them as directed. It is normal to feel mild stretching, pulling, tightness, or discomfort as you do these exercises. Stop right away if you feel sudden pain or your pain gets worse. Do not begin these exercises until told by your health care provider. Stretching exercises External rotation and abduction This exercise is sometimes called corner stretch. This exercise rotates your arm outward (external rotation) and moves your arm out from your body (abduction). Stand in a doorway with one of your feet slightly in front of the other. This is called a staggered stance. If you cannot reach your forearms to the door frame, stand facing a corner of a room. Choose one of the following positions as told by your health care provider: Place your hands and forearms on the door frame above your head. Place your hands and forearms on the door frame at the height of your head. Place your hands on the door frame at the height of your elbows. Slowly move your weight onto your front foot until you feel a stretch across your chest and in the front of your shoulders. Keep your head and chest upright and keep your abdominal muscles tight. Hold for __________ seconds. To release the stretch, shift your weight to your back foot. Repeat __________ times. Complete this exercise __________ times a day. Extension, standing Stand and hold a broomstick, a cane, or a similar object behind your back. Your hands should be a little wider than shoulder width apart. Your palms should face away from your back. Keeping your elbows straight and your shoulder muscles relaxed, move the stick away from your body until you feel a stretch in your shoulders (extension). Avoid shrugging your shoulders while you move the stick. Keep your shoulder blades tucked down toward the middle of your  back. Hold for __________ seconds. Slowly return to the starting position. Repeat __________ times. Complete this exercise __________ times a day. Range-of-motion exercises Pendulum  Stand near a wall or a surface that you can hold onto for balance. Bend at the waist and let your left / right arm hang straight down. Use your other arm to support you. Keep your back straight and do not lock your knees. Relax your left / right arm and shoulder muscles, and move your hips and your trunk so your left / right arm swings freely. Your arm should swing because of the motion of your body, not because you are using your arm or shoulder muscles. Keep moving your hips and trunk so your arm swings in the following directions, as told by your health care provider: Side to side. Forward and backward. In clockwise and counterclockwise circles. Continue each motion for __________ seconds, or for as long as told by your health care provider. Slowly return to the starting position. Repeat __________ times. Complete this exercise __________ times a day. Shoulder flexion, standing  Stand and hold a broomstick, a cane, or a similar object. Place your hands a little more than shoulder width apart on the object. Your left / right hand should be palm up, and your other hand should be palm down. Keep your elbow straight and your shoulder muscles relaxed. Push the stick up with your healthy arm to raise your left / right arm in front of your body, and then over your head until you feel a stretch in your shoulder (flexion). Avoid   shrugging your shoulder while you raise your arm. Keep your shoulder blade tucked down toward the middle of your back. Hold for __________ seconds. Slowly return to the starting position. Repeat __________ times. Complete this exercise __________ times a day. Shoulder abduction, standing Stand and hold a broomstick, a cane, or a similar object. Place your hands a little more than shoulder  width apart on the object. Your left / right hand should be palm up, and your other hand should be palm down. Keep your elbow straight and your shoulder muscles relaxed. Push the object across your body toward your left / right side. Raise your left / right arm to the side of your body (abduction) until you feel a stretch in your shoulder. Do not raise your arm above shoulder height unless your health care provider tells you to do that. If directed, raise your arm over your head. Avoid shrugging your shoulder while you raise your arm. Keep your shoulder blade tucked down toward the middle of your back. Hold for __________ seconds. Slowly return to the starting position. Repeat __________ times. Complete this exercise __________ times a day. Internal rotation  Place your left / right hand behind your back, palm up. Use your other hand to dangle an exercise band, a towel, or a similar object over your shoulder. Grasp the band with your left / right hand so you are holding on to both ends. Gently pull up on the band until you feel a stretch in the front of your left / right shoulder. The movement of your arm toward the center of your body is called internal rotation. Avoid shrugging your shoulder while you raise your arm. Keep your shoulder blade tucked down toward the middle of your back. Hold for __________ seconds. Release the stretch by letting go of the band and lowering your hands. Repeat __________ times. Complete this exercise __________ times a day. Strengthening exercises External rotation  Sit in a stable chair without armrests. Secure an exercise band to a stable object at elbow height on your left / right side. Place a soft object, such as a folded towel or a small pillow, between your left / right upper arm and your body to move your elbow about 4 inches (10 cm) away from your side. Hold the end of the exercise band so it is tight and there is no slack. Keeping your elbow pressed  against the soft object, slowly move your forearm out, away from your abdomen (external rotation). Keep your body steady so only your forearm moves. Hold for __________ seconds. Slowly return to the starting position. Repeat __________ times. Complete this exercise __________ times a day. Shoulder abduction  Sit in a stable chair without armrests, or stand up. Hold a __________ weight in your left / right hand, or hold an exercise band with both hands. Start with your arms straight down and your left / right palm facing in, toward your body. Slowly lift your left / right hand out to your side (abduction). Do not lift your hand above shoulder height unless your health care provider tells you that this is safe. Keep your arms straight. Avoid shrugging your shoulder while you do this movement. Keep your shoulder blade tucked down toward the middle of your back. Hold for __________ seconds. Slowly lower your arm, and return to the starting position. Repeat __________ times. Complete this exercise __________ times a day. Shoulder extension Sit in a stable chair without armrests, or stand up. Secure an exercise band   to a stable object in front of you so it is at shoulder height. Hold one end of the exercise band in each hand. Your palms should face each other. Straighten your elbows and lift your hands up to shoulder height. Step back, away from the secured end of the exercise band, until the band is tight and there is no slack. Squeeze your shoulder blades together as you pull your hands down to the sides of your thighs (extension). Stop when your hands are straight down by your sides. Do not let your hands go behind your body. Hold for __________ seconds. Slowly return to the starting position. Repeat __________ times. Complete this exercise __________ times a day. Shoulder row Sit in a stable chair without armrests, or stand up. Secure an exercise band to a stable object in front of you so it  is at waist height. Hold one end of the exercise band in each hand. Position your palms so that your thumbs are facing the ceiling (neutral position). Bend each of your elbows to a 90-degree angle (right angle) and keep your upper arms at your sides. Step back until the band is tight and there is no slack. Slowly pull your elbows back behind you. Hold for __________ seconds. Slowly return to the starting position. Repeat __________ times. Complete this exercise __________ times a day. Shoulder press-ups  Sit in a stable chair that has armrests. Sit upright, with your feet flat on the floor. Put your hands on the armrests so your elbows are bent and your fingers are pointing forward. Your hands should be about even with the sides of your body. Push down on the armrests and use your arms to lift yourself off the chair. Straighten your elbows and lift yourself up as much as you comfortably can. Move your shoulder blades down, and avoid letting your shoulders move up toward your ears. Keep your feet on the ground. As you get stronger, your feet should support less of your body weight as you lift yourself up. Hold for __________ seconds. Slowly lower yourself back into the chair. Repeat __________ times. Complete this exercise __________ times a day. Wall push-ups  Stand so you are facing a stable wall. Your feet should be about one arm-length away from the wall. Lean forward and place your palms on the wall at shoulder height. Keep your feet flat on the floor as you bend your elbows and lean forward toward the wall. Hold for __________ seconds. Straighten your elbows to push yourself back to the starting position. Repeat __________ times. Complete this exercise __________ times a day. This information is not intended to replace advice given to you by your health care provider. Make sure you discuss any questions you have with your healthcare provider. Document Revised: 04/15/2018 Document  Reviewed: 01/21/2018 Elsevier Patient Education  2022 [employer]  

## 2021-01-17 DIAGNOSIS — Z6823 Body mass index (BMI) 23.0-23.9, adult: Secondary | ICD-10-CM | POA: Diagnosis not present

## 2021-01-17 DIAGNOSIS — Z79899 Other long term (current) drug therapy: Secondary | ICD-10-CM | POA: Diagnosis not present

## 2021-01-17 DIAGNOSIS — E1169 Type 2 diabetes mellitus with other specified complication: Secondary | ICD-10-CM | POA: Diagnosis not present

## 2021-01-17 DIAGNOSIS — K219 Gastro-esophageal reflux disease without esophagitis: Secondary | ICD-10-CM | POA: Diagnosis not present

## 2021-01-17 DIAGNOSIS — I700 Atherosclerosis of aorta: Secondary | ICD-10-CM | POA: Diagnosis not present

## 2021-01-17 DIAGNOSIS — E785 Hyperlipidemia, unspecified: Secondary | ICD-10-CM | POA: Diagnosis not present

## 2021-02-05 DIAGNOSIS — Z982 Presence of cerebrospinal fluid drainage device: Secondary | ICD-10-CM | POA: Diagnosis not present

## 2021-02-05 DIAGNOSIS — I6523 Occlusion and stenosis of bilateral carotid arteries: Secondary | ICD-10-CM | POA: Diagnosis not present

## 2021-02-05 DIAGNOSIS — I6623 Occlusion and stenosis of bilateral posterior cerebral arteries: Secondary | ICD-10-CM | POA: Diagnosis not present

## 2021-02-05 DIAGNOSIS — E119 Type 2 diabetes mellitus without complications: Secondary | ICD-10-CM | POA: Diagnosis not present

## 2021-02-05 DIAGNOSIS — E785 Hyperlipidemia, unspecified: Secondary | ICD-10-CM | POA: Diagnosis not present

## 2021-02-05 DIAGNOSIS — I6389 Other cerebral infarction: Secondary | ICD-10-CM

## 2021-02-05 DIAGNOSIS — Z7982 Long term (current) use of aspirin: Secondary | ICD-10-CM | POA: Diagnosis not present

## 2021-02-05 DIAGNOSIS — H532 Diplopia: Secondary | ICD-10-CM | POA: Diagnosis not present

## 2021-02-05 DIAGNOSIS — Z79899 Other long term (current) drug therapy: Secondary | ICD-10-CM | POA: Diagnosis not present

## 2021-02-05 DIAGNOSIS — I38 Endocarditis, valve unspecified: Secondary | ICD-10-CM | POA: Diagnosis not present

## 2021-02-05 DIAGNOSIS — K219 Gastro-esophageal reflux disease without esophagitis: Secondary | ICD-10-CM | POA: Diagnosis not present

## 2021-02-05 DIAGNOSIS — R519 Headache, unspecified: Secondary | ICD-10-CM | POA: Diagnosis not present

## 2021-02-05 DIAGNOSIS — Z20822 Contact with and (suspected) exposure to covid-19: Secondary | ICD-10-CM | POA: Diagnosis not present

## 2021-02-05 DIAGNOSIS — R030 Elevated blood-pressure reading, without diagnosis of hypertension: Secondary | ICD-10-CM | POA: Diagnosis not present

## 2021-02-06 DIAGNOSIS — Z982 Presence of cerebrospinal fluid drainage device: Secondary | ICD-10-CM | POA: Diagnosis not present

## 2021-02-06 DIAGNOSIS — H532 Diplopia: Secondary | ICD-10-CM | POA: Diagnosis not present

## 2021-02-06 DIAGNOSIS — R519 Headache, unspecified: Secondary | ICD-10-CM | POA: Diagnosis not present

## 2021-02-06 DIAGNOSIS — K219 Gastro-esophageal reflux disease without esophagitis: Secondary | ICD-10-CM | POA: Diagnosis not present

## 2021-02-18 DIAGNOSIS — H4922 Sixth [abducent] nerve palsy, left eye: Secondary | ICD-10-CM | POA: Diagnosis not present

## 2021-03-07 ENCOUNTER — Ambulatory Visit: Payer: Medicare Other | Admitting: Neurology

## 2021-04-24 ENCOUNTER — Ambulatory Visit: Payer: Medicare Other | Admitting: Neurology

## 2021-04-29 ENCOUNTER — Ambulatory Visit: Payer: Medicare Other | Admitting: Rheumatology

## 2021-05-13 ENCOUNTER — Ambulatory Visit (INDEPENDENT_AMBULATORY_CARE_PROVIDER_SITE_OTHER): Payer: Medicare Other | Admitting: Neurology

## 2021-05-13 ENCOUNTER — Encounter: Payer: Self-pay | Admitting: Neurology

## 2021-05-13 VITALS — BP 158/91 | HR 88 | Wt 127.0 lb

## 2021-05-13 DIAGNOSIS — H4920 Sixth [abducent] nerve palsy, unspecified eye: Secondary | ICD-10-CM | POA: Diagnosis not present

## 2021-05-13 DIAGNOSIS — H6993 Unspecified Eustachian tube disorder, bilateral: Secondary | ICD-10-CM | POA: Diagnosis not present

## 2021-05-13 DIAGNOSIS — G932 Benign intracranial hypertension: Secondary | ICD-10-CM

## 2021-05-13 DIAGNOSIS — Z982 Presence of cerebrospinal fluid drainage device: Secondary | ICD-10-CM

## 2021-05-13 DIAGNOSIS — H938X3 Other specified disorders of ear, bilateral: Secondary | ICD-10-CM | POA: Diagnosis not present

## 2021-05-13 MED ORDER — TOPIRAMATE 25 MG PO TABS: ORAL_TABLET | ORAL | 6 refills | Status: DC

## 2021-05-13 NOTE — Patient Instructions (Addendum)
Start with '25mg'$  at bedtime(1 tab). In 2 weeks increase to '50mg'$  (2 tabs). Then call in 2-4 weeks for further increase if no side effects. To see if this is increased intracranial pressure ? ?Allergist - eustacian tube dysfunction? Allergies?  ? ?Intracranial Hypertension ? ?intracranial hypertension is a condition that increases pressure around the brain. The fluid that surrounds the brain and spinal cord (cerebrospinal fluid, or CSF) increases and causes the pressure.  ? ?What are the signs or symptoms? ?Symptoms of this condition include: ?Headaches. This is the most common symptom. ?Brief episodes of total blindness. ?Double vision, blurred vision, or poor side (peripheral) vision. ?Pain in the shoulders or neck. ?Nausea and vomiting. ?A sound like rushing water or a pulsing sound within the ears (pulsatile tinnitus), or ringing in the ears. ?How is this diagnosed? ?This condition may be diagnosed based on: ?Your symptoms and medical history. ?Imaging tests of the brain, such as: ?CT scan. ?MRI. ?Magnetic resonance venogram (MRV) to check the veins. ?Diagnostic lumbar puncture. This is a procedure to remove and examine a sample of cerebrospinal fluid. This procedure can determine whether too much fluid may be causing IIH. ?A thorough eye exam to check for swelling or nerve damage in the eyes. ?How is this treated? ?Medicines to decrease the production of spinal fluid and lower the pressure within your skull. (Topiramate or diamox) ?Medicines to prevent or treat headaches. ?Other treatments may include: ?Surgery to place drains (shunts) in your brain for removing excess fluid. ?Lumbar puncture to remove excess cerebrospinal fluid. ?Contact a health care provider if: ?You have changes in your vision, such as: ?Double vision. ?Blurred vision. ?Poor peripheral vision. ?Get help right away if: ?You have any of the following symptoms and they get worse or do not get better: ?Headaches. ?Nausea. ?Vomiting. ?Sudden  trouble seeing. ?Summary ?intracranial hypertension is a condition that increases pressure around the brain.  ?The most common symptom is headaches. Vision changes, pain in the shoulders or neck, nausea, and vomiting may also occur. ?Treatment for this condition depends on your symptoms. The goal of treatment is to decrease the pressure around your brain.This information is not intended to replace advice given to you by your health care provider. Make sure you discuss any questions you have with your health care provider. ?Document Revised: 12/03/2018 Document Reviewed: 12/03/2018 ?Elsevier Patient Education ? [place]. ? ?Topiramate Solution ?What is this medication? ?TOPIRAMATE (toe PYRE a mate) prevents and controls seizures in people with epilepsy. It may also be used to prevent migraine headaches. It works by calming overactive nerves in your body. ?This medicine may be used for other purposes; ask your health care provider or pharmacist if you have questions. ?COMMON BRAND NAME(S): EPRONTIA ?What should I tell my care team before I take this medication? ?They need to know if you have any of these conditions: ?Bleeding disorder ?Kidney disease ?Lung disease ?Suicidal thoughts, plans or attempt ?An unusual or allergic reaction to topiramate, other medications, foods, dyes, or preservatives ?Pregnant or trying to get pregnant ?Breast-feeding ?How should I use this medication? ?Take this medication by mouth. Take it as directed on the prescription label at the same time every day. Use a specially marked oral syringe, spoon, or dropper to measure each dose. Ask your pharmacist if you do not have one. Household spoons are not accurate. You can take it with or without food. If it upsets your stomach, take it with food. Keep taking it unless your care team tells you  to stop. ?A special MedGuide will be given to you by the pharmacist with each prescription and refill. Be sure to read this information carefully  each time. ?Talk to your care team regarding the use of this medication in children. While it may be prescribed for children as young as 2 years for selected conditions, precautions do apply. ?Overdosage: If you think you have taken too much of this medicine contact a poison control center or emergency room at once. ?NOTE: This medicine is only for you. Do not share this medicine with others. ?What if I miss a dose? ?If you miss a dose, take it as soon as you can unless it is within 6 hours of the next dose. If it is within 6 hours of the next dose, skip the missed dose. Take the next dose at the normal time. Do not take double or extra doses. ?What may interact with this medication? ?Acetazolamide ?Alcohol ?Antihistamines for allergy, cough, and cold ?Aspirin and aspirin-like medications ?Atropine ?Birth control pills ?Certain medications for anxiety or sleep ?Certain medications for bladder problems like oxybutynin, tolterodine ?Certain medications for depression like amitriptyline, fluoxetine, sertraline ?Certain medications for Parkinson's disease like benztropine, trihexyphenidyl ?Certain medications for seizures like carbamazepine, lamotrigine, phenobarbital, phenytoin, primidone, valproic acid, zonisamide ?Certain medications for stomach problems like dicyclomine, hyoscyamine ?Certain medications for travel sickness like scopolamine ?Certain medications that treat or prevent blood clots like warfarin, enoxaparin, dalteparin, apixaban, dabigatran, and rivaroxaban ?Digoxin ?Diltiazem ?General anesthetics like halothane, isoflurane, methoxyflurane, propofol ?Glyburide ?Hydrochlorothiazide ?Ipratropium ?Lithium ?Medications that relax muscles ?Metformin ?Narcotic medications for pain ?NSAIDs, medications for pain and inflammation, like ibuprofen or naproxen ?Phenothiazines like chlorpromazine, mesoridazine, prochlorperazine, thioridazine ?Pioglitazone ?This list may not describe all possible interactions. Give  your health care provider a list of all the medicines, herbs, non-prescription drugs, or dietary supplements you use. Also tell them if you smoke, drink alcohol, or use illegal drugs. Some items may interact with your medicine. ?What should I watch for while using this medication? ?Visit your care team for regular checks on your progress. Tell your care team if your symptoms do not start to get better or if they get worse. ?Do not suddenly stop taking this medication. You may develop a severe reaction. Your care team will tell you how much medication to take. If your care team wants you to stop the medication, the dose may be slowly lowered over time to avoid any side effects. ?Wear a medical ID bracelet or chain. Carry a card that describes your condition. List the medications and doses you take on the card. ?You may get drowsy or dizzy. Do not drive, use machinery, or do anything that needs mental alertness until you know how this medication affects you. Do not stand up or sit up quickly, especially if you are an older patient. This reduces the risk of dizzy or fainting spells. Alcohol may interfere with the effects of this medication. Avoid alcoholic drinks. ?This medication may cause serious skin reactions. They can happen weeks to months after starting the medication. Contact your care team right away if you notice fevers or flu-like symptoms with a rash. The rash may be red or purple and then turn into blisters or peeling of the skin. Or, you might notice a red rash with swelling of the face, lips or lymph nodes in your neck or under your arms. ?Watch for new or worsening thoughts of suicide or depression. This includes sudden changes in mood, behaviors, or thoughts. These changes can happen  at any time but are more common in the beginning of treatment or after a change in dose. Call your care team right away if you experience these thoughts or worsening depression. ?This medication may slow your child's  growth if it is taken for a long time at high doses. Your care team will monitor your child's growth. ?Using this medication for a long time may weaken your bones. The risk of bone fractures may be increased. Glenda Matthews

## 2021-05-13 NOTE — Progress Notes (Signed)
?[facility] ? ? ? ?Provider:  Dr Jaynee Eagles ?Requesting Provider: Odette Fraction, OD ?Primary Care Provider:  Pcp, No ? ?CC:  sixth nerve palsy ? ?HPI:  Glenda Matthews is a 76 y.o. female here as requested by Odette Fraction, OD for diplopia.  ?She is here for acute diplopia. She was seen at [place]. Daughter is here and provides much information. She was seen at [facility]. She has been having problems with congestion, ringing I the ears, head feels like it is going to "blow up", for 2 weeks and one night she had diplopia side by side. Years ago in 1998 they found a cyst on her brain, she had a surgery for shunt and for years prior to that she had pressure and pain. Through the years she still maintained a level of pressure and headaches. She had the diplopia and she was terrified. Her eyes are bettr but she has headaches. She has ringing in the ear. She has a lot of pressure. Like her ears are full and she feels like her ears won't pop, antibiotics may clear it up, she saw an ear doctor in the past but by the time she got to the ear doctor it was improved. The ENT just looked in the ears, she had a hearing test. She had a pop once in her ear. Decongestants used to help a lot. She tried allergy medication once. The ear pressure has been ongoing intermittently since 1998 since shunting. She denies any migrainous symptoms, not pulsating or pounding, no photophobia. More pressure than anything. She has some soreness in the scalp in different places and in the neck where she had the surgery a lot of pressure. Sometimes pressure in the whole head, not really headache just a lot of pressure like your head is going to come off. No blurry vision, the vision is better. She had cataract surgery. Cold air, wind, air conditoning bothers her makes her head hurt. She can get pain in the sinuses on the left, no pain in the temples, now she is having the ringing and the buzzing in her  ears.  ? ?Reviewed notes, labs and imaging from outside physicians, which showed: ? ?I requested and reviewed any notes from 1998, found a note from Dr. 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  ? ?Reviewed images from [place] (additional 20 minutes) ? ?February 2023: personally reviewed images and agree ? ?CTA H&N:  ?IMPRESSION: ?1. No intracranial large vessel occlusion or significant stenosis. ?2. No hemodynamically significant stenosis in the neck. ? ?CT Head: ?IMPRESSION: ?1. No acute intracranial abnormalities. ?2. Chronic microvascular disease. ?3. Stable position of bilateral ventriculostomy catheters. The ?ventricles appear decompressed and unchanged in volume from previous ?exam. ? ?Shunt Series: ?IMPRESSION: ?No discontinuity is seen of right frontal ventriculoperitoneal shunt ?with tip terminating over the right hemipelvis ? ?Review of Systems: ?Patient complains of symptoms per HPI as well as the following symptoms VP shunt, pressure headache. Pertinent negatives and positives per HPI. All others negative. ? ? ?Social History  ? ?Socioeconomic History  ? Marital status: Divorced  ?  Spouse name: Not on file  ? Number of children: Not on file  ? Years of education: Not on file  ? Highest education level: Not on file  ?Occupational History  ? Not on file  ?Tobacco Use  ? Smoking status: Former  ?  Types: Cigarettes  ? Smokeless tobacco: Never  ? Tobacco comments:  ?  Social smoker in college  ?Vaping Use  ? Vaping Use: Never used  ?Substance and Sexual Activity  ? Alcohol use: Yes  ?  Comment: rarely  ? Drug use: No  ? Sexual activity: Not on file  ?Other Topics Concern  ? Not on file  ?Social History Narrative  ? Daughter stays with her   ? Right handed  ? Caffeine: decaf coffee very little tea   ? ?Social Determinants of Health  ? ?Financial Resource Strain: Not on  file  ?Food Insecurity: Not on file  ?Transportation Needs: Not on file  ?Physical Activity: Not on file  ?Stress: Not on file  ?Social Connections: Not on file  ?Intimate Partner Violence: Not on file  ? ? ?Family History  ?Problem Relation Age of Onset  ? Cancer Mother   ?     breast  ? ? ?Past Medical History:  ?Diagnosis Date  ? Anemia   ? none in recent years  ? Arthritis   ? shoulders  ? 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 Chronic kidney disease   ? kidney stone  ? Diverticulosis   ? Headache(784.0)   ? Liver cyst   ? Osteoarthritis   ? Sciatica   ? ? ?Patient Active Problem List  ? Diagnosis Date Noted  ? pressure headache 05/19/2021  ? 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 Pressure sensation in both ears 05/19/2021  ? Age-related osteoporosis without current pathological fracture 04/30/2020  ? Primary osteoarthritis of both hands 04/30/2020  ? Primary osteoarthritis of both feet 04/30/2020  ? Chronic SI joint pain 04/30/2020  ? DDD (degenerative disc disease), thoracic 04/30/2020  ? DDD (degenerative disc disease), lumbar 04/30/2020  ? Hepatic cyst 04/30/2020  ? Renal cyst 04/30/2020  ? History of diverticulosis 04/30/2020  ? History of kidney stones 04/30/2020  ? Aortic atherosclerosis ([place]) 04/30/2020  ? Lung nodule 04/30/2020  ? ? ?Past Surgical History:  ?Procedure Laterality Date  ? ABDOMINAL HYSTERECTOMY    ? BASAL CELL CARCINOMA EXCISION  12/2017  ? BRAIN SURGERY  1998  ? shunt  ? BRAIN SURGERY  1998  ? brainstem cyst removed  ? CHOLECYSTECTOMY    ? gall bladder    ? skin caner    ? TUBAL LIGATION    ? tubialligation    ? ? ?Current Outpatient Medications  ?Medication Sig Dispense Refill  ? ATORVASTATIN CALCIUM PO Take 1 tablet by mouth daily.    ? ibuprofen (ADVIL) 100 MG tablet Take 100 mg by mouth as needed for fever.    ? OneTouch Delica Lancets 22W MISC     ? ONETOUCH VERIO test strip 1 each daily.    ? pantoprazole (PROTONIX) 40 MG tablet Take 40 mg by mouth daily.    ? topiramate (TOPAMAX) 25 MG tablet Start with  '25mg'$  at bedtime(1 tab). In 2 weeks increase to '50mg'$  (2 tabs). Then call in 2-4 weeks for further increase if no side effects. 30 tablet 6  ? VITAMIN D PO Take by mouth.    ? ?No current facility-administered medications for this visit.  ? ? ?Allergies as of 05/13/2021 - Review Complete 05/13/2021  ?Allergen Reaction Noted  ? Other  01/23/2012  ? ? ?Vitals: ?BP (!) 158/91   Pulse 88   Wt 127 lb (57.6 kg)   BMI 24.00 kg/m?  ?Last Weight:  ?Wt Readings from Last 1 Encounters:  ?05/13/21  127 lb (57.6 kg)  ? ?Last Height:   ?Ht Readings from Last 1 Encounters:  ?10/29/20 '5\' 1"'$  (1.549 m)  ? ? ? ?Physical exam: ?Exam: ?Gen: NAD, conversant, well nourised, obese, well groomed                     ?CV: RRR, no MRG. No Carotid Bruits. No peripheral edema, warm, nontender ?Eyes: Conjunctivae clear without exudates or hemorrhage ? ?Neuro: ?Detailed Neurologic Exam ? ?Speech: ?   Speech is normal; fluent and spontaneous with normal comprehension.  ?Cognition: ?   The patient is oriented to person, place, and time;  ?   recent and remote memory appear slightly impaired;  ?   language fluent;  ?   Impaired attention, concentration, fund of knowledge ?Cranial Nerves: ?   The pupils are equal, round, and reactive to light.  Attempted, pupils too small to visualize fundi. visual fields are full to finger confrontation. Extraocular movements are intact. Trigeminal sensation is intact and the muscles of mastication are normal. The face is symmetric. The palate elevates in the midline. Hearing intact. Voice is normal. Shoulder shrug is normal. The tongue has normal motion without fasciculations.  ? ?Coordination: ?   Normal  ? ?Gait: ?   Heel-toe and tandem gait are normal.  ? ?Motor Observation: ?   No asymmetry, no atrophy, and no involuntary movements noted. ?Tone: ?   Normal muscle tone.   ? ?Posture: ?   Posture is normal. normal erect ?   ?Strength: ?   Strength is V/V in the upper and lower limbs.  ?    ?Sensation: intact to  LT ?    ?Reflex Exam: ? ?DTR's: ?   Deep tendon reflexes in the upper and lower extremities are symmetrical bilaterally.   ?Toes: ?   The toes are downgoing bilaterally.   ?Clonus: ?   Clonus is absent. ?  ?

## 2021-05-19 ENCOUNTER — Encounter: Payer: Self-pay | Admitting: Neurology

## 2021-05-19 DIAGNOSIS — Z982 Presence of cerebrospinal fluid drainage device: Secondary | ICD-10-CM | POA: Insufficient documentation

## 2021-05-19 DIAGNOSIS — G932 Benign intracranial hypertension: Secondary | ICD-10-CM | POA: Insufficient documentation

## 2021-05-19 DIAGNOSIS — H938X3 Other specified disorders of ear, bilateral: Secondary | ICD-10-CM | POA: Insufficient documentation

## 2021-05-22 ENCOUNTER — Ambulatory Visit: Payer: Medicare Other | Admitting: Neurology

## 2021-05-27 ENCOUNTER — Ambulatory Visit: Payer: Medicare Other | Admitting: Rheumatology

## 2021-05-27 ENCOUNTER — Telehealth: Payer: Self-pay | Admitting: Neurology

## 2021-05-27 DIAGNOSIS — M19072 Primary osteoarthritis, left ankle and foot: Secondary | ICD-10-CM

## 2021-05-27 DIAGNOSIS — M5134 Other intervertebral disc degeneration, thoracic region: Secondary | ICD-10-CM

## 2021-05-27 DIAGNOSIS — M7061 Trochanteric bursitis, right hip: Secondary | ICD-10-CM

## 2021-05-27 DIAGNOSIS — Z87442 Personal history of urinary calculi: Secondary | ICD-10-CM

## 2021-05-27 DIAGNOSIS — N281 Cyst of kidney, acquired: Secondary | ICD-10-CM

## 2021-05-27 DIAGNOSIS — G932 Benign intracranial hypertension: Secondary | ICD-10-CM

## 2021-05-27 DIAGNOSIS — R768 Other specified abnormal immunological findings in serum: Secondary | ICD-10-CM

## 2021-05-27 DIAGNOSIS — K7689 Other specified diseases of liver: Secondary | ICD-10-CM

## 2021-05-27 DIAGNOSIS — M19041 Primary osteoarthritis, right hand: Secondary | ICD-10-CM

## 2021-05-27 DIAGNOSIS — M25512 Pain in left shoulder: Secondary | ICD-10-CM

## 2021-05-27 DIAGNOSIS — R911 Solitary pulmonary nodule: Secondary | ICD-10-CM

## 2021-05-27 DIAGNOSIS — Z8719 Personal history of other diseases of the digestive system: Secondary | ICD-10-CM

## 2021-05-27 DIAGNOSIS — M810 Age-related osteoporosis without current pathological fracture: Secondary | ICD-10-CM

## 2021-05-27 DIAGNOSIS — Z840 Family history of diseases of the skin and subcutaneous tissue: Secondary | ICD-10-CM

## 2021-05-27 DIAGNOSIS — I700 Atherosclerosis of aorta: Secondary | ICD-10-CM

## 2021-05-27 DIAGNOSIS — M5136 Other intervertebral disc degeneration, lumbar region: Secondary | ICD-10-CM

## 2021-05-27 DIAGNOSIS — E559 Vitamin D deficiency, unspecified: Secondary | ICD-10-CM

## 2021-05-27 NOTE — Telephone Encounter (Signed)
I called pt and explained Dr. Ferdinand Lango recommendation for the topamax increase to '50mg'$  po qhs for 2 wks, if no side effects then to increase to '100mg'$  po qhs.  She states that she had watery eyes, feeling of fluid in L ear, that is better.  She had tinnitis muffled hearing.  She had been to ENT and had audiogram one year ago and was ok.  She states that when she wakes up the pressure is better then when gets going moving around, eating then feels the pressure again.  She will increase to '50mg'$  po qhs and let us know how she does or is has a problem.  I gave her the # to the allergy office Dr Neldon Mc on [address].  She should be able to call and make appt, as referral in the [facility] system.  She verbalized understanding.

## 2021-05-27 NOTE — Telephone Encounter (Signed)
Pt was told to call and give an update as to how she is doing 2 weeks after being on the topiramate (TOPAMAX) 25 MG tablet, pt states no better no worse.  Please call

## 2021-06-04 MED ORDER — TOPIRAMATE 25 MG PO TABS: 50.0000 mg | ORAL_TABLET | Freq: Every day | ORAL | 0 refills | Status: DC

## 2021-06-04 NOTE — Telephone Encounter (Signed)
Pt states now that she is taking 2 a day of the topiramate (TOPAMAX) 25 MG tablet, she needs a new Rx sent to [id number]

## 2021-06-04 NOTE — Addendum Note (Signed)
Addended by: Brandon Melnick on: 06/04/2021 02:56 PM   Modules accepted: Orders

## 2021-06-04 NOTE — Telephone Encounter (Signed)
Done ('50mg'$  po qhs) call to increase if no side effects.

## 2021-06-18 DIAGNOSIS — Z760 Encounter for issue of repeat prescription: Secondary | ICD-10-CM | POA: Diagnosis not present

## 2021-06-18 DIAGNOSIS — E7800 Pure hypercholesterolemia, unspecified: Secondary | ICD-10-CM | POA: Diagnosis not present

## 2021-06-30 ENCOUNTER — Telehealth: Payer: Self-pay | Admitting: Neurology

## 2021-06-30 DIAGNOSIS — G932 Benign intracranial hypertension: Secondary | ICD-10-CM

## 2021-07-02 MED ORDER — TOPIRAMATE 25 MG PO TABS: 25.0000 mg | ORAL_TABLET | Freq: Every day | ORAL | 0 refills | Status: DC

## 2021-07-02 NOTE — Addendum Note (Signed)
Addended by: Gildardo Griffes on: 07/02/2021 12:59 PM   Modules accepted: Orders

## 2021-07-02 NOTE — Telephone Encounter (Signed)
Called pt and advised her per Dr Jaynee Eagles to go back to taking Topiramate 25 mg at bedtime and see if her symptoms resolve. Pt's questions were answered. She was advised to call if she has another episode. She was also advised that if she worsens or develops any changes to get checked out at the hospital. She verbalized understanding and appreciation.

## 2021-07-30 ENCOUNTER — Ambulatory Visit (INDEPENDENT_AMBULATORY_CARE_PROVIDER_SITE_OTHER): Payer: Medicare Other | Admitting: Neurology

## 2021-07-30 ENCOUNTER — Encounter: Payer: Self-pay | Admitting: Neurology

## 2021-07-30 DIAGNOSIS — G932 Benign intracranial hypertension: Secondary | ICD-10-CM | POA: Diagnosis not present

## 2021-07-30 MED ORDER — TOPIRAMATE 25 MG PO TABS: 25.0000 mg | ORAL_TABLET | Freq: Every day | ORAL | 4 refills | Status: AC

## 2021-07-30 NOTE — Progress Notes (Signed)
[facility]    Provider:  Dr Jaynee Eagles Requesting Provider: No ref. provider found Primary Care Provider:  Pcp, No  CC:  sixth nerve palsy  07/30/2021: Diplopia likely microvascular and resolved by last appointment,  the headaches we started topiramate '25mg'$  but she had side effects on '50mg'$  went back to '25mg'$  and she has been on it since. '25mg'$  has helped. She perseverates on side effects of medications. Her daughter is here and provides information. We will keep her on the lower dose '25mg'$ . When she was on '50mg'$  the pressure was better but she says she had vertigo the room would spin when she turned her head to the right. Sounds like bppv and not the medication but patient is so obsessed with side effects we will keep her on the '25mg'$ . Spent extended visit talking to her and daughter, she perseverates on side effects, No other focal neurologic deficits, associated symptoms, inciting events or modifiable factors.  Patient complains of symptoms per HPI as well as the following symptoms: eye watering . Pertinent negatives and positives per HPI. All others negative   HPI:  Glenda Matthews is a 76 y.o. female here as requested by No ref. provider found for diplopia.  She is here for acute diplopia. She was seen at [place]. Daughter is here and provides much information. She was seen at [facility]. She has been having problems with congestion, ringing I the ears, head feels like it is going to "blow up", for 2 weeks and one night she had diplopia side by side. Years ago in 1998 they found a cyst on her brain, she had a surgery for shunt and for years prior to that she had pressure and pain. Through the years she still maintained a level of pressure and headaches. She had the diplopia and she was terrified. Her eyes are bettr but she has headaches. She has ringing in the ear. She has a lot of pressure. Like her ears are full and she feels like her ears won't pop, antibiotics may  clear it up, she saw an ear doctor in the past but by the time she got to the ear doctor it was improved. The ENT just looked in the ears, she had a hearing test. She had a pop once in her ear. Decongestants used to help a lot. She tried allergy medication once. The ear pressure has been ongoing intermittently since 1998 since shunting. She denies any migrainous symptoms, not pulsating or pounding, no photophobia. More pressure than anything. She has some soreness in the scalp in different places and in the neck where she had the surgery a lot of pressure. Sometimes pressure in the whole head, not really headache just a lot of pressure like your head is going to come off. No blurry vision, the vision is better. She had cataract surgery. Cold air, wind, air conditoning bothers her makes her head hurt. She can get pain in the sinuses on the left, no pain in the temples, now she is having the ringing and the buzzing in her ears.   Reviewed notes, labs and imaging from outside physicians, which showed:  I requested and reviewed any notes from 1998, found a note from Dr. 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   Reviewed images from [place] (additional 20 minutes)  February 2023: personally reviewed images and agree  CTA H&N:  IMPRESSION: 1. No intracranial large vessel occlusion or significant stenosis. 2. No hemodynamically significant stenosis in the neck.  CT Head: IMPRESSION: 1. No acute intracranial abnormalities. 2. Chronic microvascular disease. 3. Stable position of bilateral ventriculostomy catheters. The ventricles appear decompressed and unchanged in volume from previous exam.  Shunt Series: IMPRESSION: No discontinuity is seen of right frontal ventriculoperitoneal shunt with tip terminating over the right hemipelvis  Review of  Systems: Patient complains of symptoms per HPI as well as the following symptoms VP shunt, pressure headache. Pertinent negatives and positives per HPI. All others negative.   Social History   Socioeconomic History   Marital status: Divorced    Spouse name: Not on file   Number of children: Not on file   Years of education: Not on file   Highest education level: Not on file  Occupational History   Not on file  Tobacco Use   Smoking status: Former    Types: Cigarettes   Smokeless tobacco: Never   Tobacco comments:    Social smoker in college  Vaping Use   Vaping Use: Never used  Substance and Sexual Activity   Alcohol use: Yes    Comment: rarely   Drug use: No   Sexual activity: Not on file  Other Topics Concern   Not on file  Social History Narrative   Daughter stays with her    Right handed   Caffeine: decaf coffee very little tea    Social Determinants of Health   Financial Resource Strain: Not on file  Food Insecurity: Not on file  Transportation Needs: Not on file  Physical Activity: Not on file  Stress: Not on file  Social Connections: Not on file  Intimate Partner Violence: Not on file    Family History  Problem Relation Age of Onset   Cancer Mother        breast   Seizures Neg Hx     Past Medical History:  Diagnosis Date   Anemia    none in recent years   Arthritis    shoulders   Brain cyst    brainstem   Chronic kidney disease    kidney stone   Diverticulosis    Headache(784.0)    Liver cyst    Osteoarthritis    Sciatica     Patient Active Problem List   Diagnosis Date Noted   pressure headache 05/19/2021   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   Pressure sensation in both ears 05/19/2021   Age-related osteoporosis without current pathological fracture 04/30/2020   Primary osteoarthritis of both hands 04/30/2020   Primary osteoarthritis of both feet 04/30/2020   Chronic SI joint pain 04/30/2020   DDD (degenerative disc disease), thoracic  04/30/2020   DDD (degenerative disc disease), lumbar 04/30/2020   Hepatic cyst 04/30/2020   Renal cyst 04/30/2020   History of diverticulosis 04/30/2020   History of kidney stones 04/30/2020   Aortic atherosclerosis ([place]) 04/30/2020   Lung nodule 04/30/2020    Past Surgical History:  Procedure Laterality Date   ABDOMINAL HYSTERECTOMY     BASAL CELL CARCINOMA EXCISION  12/2017   BRAIN SURGERY  1998   shunt   BRAIN SURGERY  1998   brainstem cyst removed   CHOLECYSTECTOMY     gall bladder     skin caner     TUBAL LIGATION     tubialligation      Current Outpatient Medications  Medication Sig Dispense Refill   ATORVASTATIN CALCIUM PO Take 1 tablet by  mouth daily.     ibuprofen (ADVIL) 100 MG tablet Take 100 mg by mouth as needed for fever.     OneTouch Delica Lancets 36U MISC      ONETOUCH VERIO test strip 1 each daily.     pantoprazole (PROTONIX) 40 MG tablet Take 40 mg by mouth daily.     VITAMIN D PO Take by mouth.     topiramate (TOPAMAX) 25 MG tablet Take 1 tablet (25 mg total) by mouth at bedtime. 90 tablet 4   No current facility-administered medications for this visit.    Allergies as of 07/30/2021 - Review Complete 07/30/2021  Allergen Reaction Noted   Other  01/23/2012    Vitals: BP (!) 148/87   Pulse (!) 109   Ht '5\' 1"'$  (1.549 m)   Wt 118 lb (53.5 kg)   BMI 22.30 kg/m  Last Weight:  Wt Readings from Last 1 Encounters:  07/30/21 118 lb (53.5 kg)   Last Height:   Ht Readings from Last 1 Encounters:  07/30/21 '5\' 1"'$  (1.549 m)   Exam: NAD, pleasant                  Speech:    Speech is normal; fluent and spontaneous with normal comprehension.  Cognition:    The patient is oriented to person, place, and time;     recent and remote memory intact;     language fluent;    Cranial Nerves:    The pupils are equal, round, and reactive to light.Trigeminal sensation is intact and the muscles of mastication are normal. The face is symmetric. The palate  elevates in the midline. Hearing intact. Voice is normal. Shoulder shrug is normal. The tongue has normal motion without fasciculations.   Coordination:  No dysmetria  Motor Observation:    No asymmetry, no atrophy, and no involuntary movements noted. Tone:    Normal muscle tone.     Strength:    Strength is V/V in the upper and lower limbs.      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 Extended appointment today discussing other issues such as feelings of pressure in the head since the surgery. Her workup at [facility] including CT head and shunt series were unremarkable and unchanged. Through the years she still maintained a level of pressure and headaches,  She had a pop once in her ear and Decongestants used to help a lot.  The ear pressure has been ongoing intermittently since 1998 since shunting. She denies any migrainous symptoms, not pulsating or pounding, no photophobia. More pressure than anything.  Sometimes pressure in the whole head, not really headache just a lot of pressure like your head is going to come off. left, no pain in the temples, now she is having the ringing and the buzzing in her ears.   On discussion today with daughter and patient, I am not sure what the etiology is especially given the imaging is stable and unremarkable, she does feel a lot of pressure in the ears could be eustachian tube issues, maybe she has some increased intracranial pressure and we can try topiramate, we discussed a lot of options including LP and I do not think that would be appropriate at this time.  I would recommend she  follow-up with allergist or ENT.  Saw her for follow-up and had an extended visit with daughter going over all the side effects today  Extended  visit today, results of discussion were to cotinueTopiramate: Doing better on '25mg'$ . Could not tolerate higher. Continue.   Allergist - eustacian tube dysfunction? Allergies? She has fullness in the ears, ear popping, Referred, seeing allergist next week.  Meds ordered this encounter  Medications   topiramate (TOPAMAX) 25 MG tablet    Sig: Take 1 tablet (25 mg total) by mouth at bedtime.    Dispense:  90 tablet    Refill:  4    Cc: No ref. provider found,  Pcp, No  Sarina Ill, MD  [facility] [address] [place], [place] [address]  Phone [phone] Fax [phone]  I spent 40 minutes of face-to-face and non-face-to-face time with patient on the  1. pressure headache     diagnosis.  This included previsit chart review, lab review, study review, order entry, electronic health record documentation, patient education on the different diagnostic and therapeutic options, counseling and coordination of care, risks and benefits of management, compliance, or risk factor reduction

## 2021-08-06 DIAGNOSIS — E1169 Type 2 diabetes mellitus with other specified complication: Secondary | ICD-10-CM | POA: Diagnosis not present

## 2021-08-06 DIAGNOSIS — E119 Type 2 diabetes mellitus without complications: Secondary | ICD-10-CM | POA: Diagnosis not present

## 2021-08-06 DIAGNOSIS — E785 Hyperlipidemia, unspecified: Secondary | ICD-10-CM | POA: Diagnosis not present

## 2021-08-06 DIAGNOSIS — M810 Age-related osteoporosis without current pathological fracture: Secondary | ICD-10-CM | POA: Diagnosis not present

## 2021-08-06 DIAGNOSIS — E1159 Type 2 diabetes mellitus with other circulatory complications: Secondary | ICD-10-CM | POA: Diagnosis not present

## 2021-08-06 DIAGNOSIS — I152 Hypertension secondary to endocrine disorders: Secondary | ICD-10-CM | POA: Diagnosis not present

## 2021-08-06 DIAGNOSIS — Z982 Presence of cerebrospinal fluid drainage device: Secondary | ICD-10-CM | POA: Diagnosis not present

## 2021-08-11 ENCOUNTER — Ambulatory Visit (INDEPENDENT_AMBULATORY_CARE_PROVIDER_SITE_OTHER): Payer: Medicare Other | Admitting: Allergy and Immunology

## 2021-08-11 VITALS — BP 118/84 | HR 102 | Resp 18 | Ht 61.0 in | Wt 120.2 lb

## 2021-08-11 DIAGNOSIS — J310 Chronic rhinitis: Secondary | ICD-10-CM | POA: Diagnosis not present

## 2021-08-11 DIAGNOSIS — H6983 Other specified disorders of Eustachian tube, bilateral: Secondary | ICD-10-CM

## 2021-08-11 DIAGNOSIS — J309 Allergic rhinitis, unspecified: Secondary | ICD-10-CM

## 2021-08-11 DIAGNOSIS — T50905A Adverse effect of unspecified drugs, medicaments and biological substances, initial encounter: Secondary | ICD-10-CM

## 2021-08-11 DIAGNOSIS — H9313 Tinnitus, bilateral: Secondary | ICD-10-CM | POA: Diagnosis not present

## 2021-08-11 MED ORDER — MONTELUKAST SODIUM 10 MG PO TABS: 10.0000 mg | ORAL_TABLET | Freq: Every day | ORAL | 5 refills | Status: AC

## 2021-08-11 NOTE — Progress Notes (Unsigned)
[facility] [place] [facility] [place] [facility] [place] [facility] [place]   Dear Dr. Jaynee Eagles,  Thank you for referring Glenda Matthews to the [facility] on 08/11/2021.   Below is a summation of this patient's evaluation and recommendations.  Thank you for your referral. I will keep you informed about this patient's response to treatment.   If you have any questions please do not hesitate to contact me.   Sincerely,  Jiles Prows, MD Allergy / Immunology [place]   ______________________________________________________________________    NEW PATIENT NOTE  Referring Provider: Melvenia Beam, MD Primary Provider: Bernerd Limbo, MD Date of office visit: 08/11/2021    Subjective:   Chief Complaint:  Glenda Matthews (DOB: Jan 20, 1945) is a 76 y.o. female who presents to the clinic on 08/11/2021 with a chief complaint of Other (Ear issues - has seen several doctors and still not sure the cause of her muffled hearing. Saw ENT a year ago and everthing came back normal - no fluid or infection. ) .     HPI: Glenda Matthews presents to this clinic in evaluation of ear issues.  She has a long history of intermittent rhinitis manifested as nasal congestion and sniffing and snorting for which she has been under the care of various people and has used various medications including nasal steroids in the past.  Usually she finds this issue to be intermittent, without any obvious trigger, and usually not requiring much medication.  What bothers her the most is her ears.  Over the course of the past 18 months she has been having a "hollow sound" and a "dull echo" giving rise to impaired hearing.  It started on the right side and then migrated to the left side and now is involved both ears and it waxes and wanes throughout the day.  She has seen ENT, ophthalmologist, and neurologist for this issue including an issue with a  transient episode of double vision that occurred in February 2023, with a negative diagnostic evaluation. She has been placed on Topamax which may or may not have helped her ear issue.  The onset of her ear issue does appear to correlate temporally with the use of atorvastatin.  She also has reflux that at this point in time she feels is under very good control with the use of pantoprazole.  Past Medical History:  Diagnosis Date   Anemia    none in recent years   Arthritis    shoulders   Brain cyst    brainstem   Chronic kidney disease    kidney stone   Diverticulosis    Headache(784.0)    Liver cyst    Osteoarthritis    Sciatica     Past Surgical History:  Procedure Laterality Date   ABDOMINAL HYSTERECTOMY     BASAL CELL CARCINOMA EXCISION  12/2017   BRAIN SURGERY  1998   shunt   BRAIN SURGERY  1998   brainstem cyst removed   CHOLECYSTECTOMY     gall bladder     skin caner     TUBAL LIGATION     tubialligation      Allergies as of 08/11/2021       Reactions   Other    Steroids: Intolerant         Medication List    ATORVASTATIN CALCIUM PO Take 1 tablet by mouth daily.   ibuprofen 100 MG tablet Commonly  known as: ADVIL Take 100 mg by mouth as needed for fever.   OneTouch Delica Lancets 36O Misc   OneTouch Verio test strip Generic drug: glucose blood 1 each daily.   pantoprazole 40 MG tablet Commonly known as: PROTONIX Take 40 mg by mouth daily.   topiramate 25 MG tablet Commonly known as: Topamax Take 1 tablet (25 mg total) by mouth at bedtime.   VITAMIN D PO Take by mouth.    Review of systems negative except as noted in HPI / PMHx or noted below:  Review of Systems  Constitutional: Negative.   HENT: Negative.    Eyes: Negative.   Respiratory: Negative.    Cardiovascular: Negative.   Gastrointestinal: Negative.   Genitourinary: Negative.   Musculoskeletal: Negative.   Skin: Negative.   Neurological: Negative.    Endo/Heme/Allergies: Negative.   Psychiatric/Behavioral: Negative.      Family History  Problem Relation Age of Onset   Cancer Mother        breast   Seizures Neg Hx     Social History   Socioeconomic History   Marital status: Widowed    Spouse name: Not on file   Number of children: Not on file   Years of education: Not on file   Highest education level: Not on file  Occupational History   Not on file  Tobacco Use   Smoking status: Former    Types: Cigarettes   Smokeless tobacco: Never   Tobacco comments:    Social smoker in college  Vaping Use   Vaping Use: Never used  Substance and Sexual Activity   Alcohol use: Yes    Comment: rarely   Drug use: No   Sexual activity: Not on file  Other Topics Concern   Not on file  Social History Narrative   Daughter stays with her    Right handed   Caffeine: decaf coffee very little tea    Environmental and Social history  Lives in a house with a dry environment, no animals located inside the household, carpeting in the bedroom, no plastic on the bed, no plastic on the pillow, and no smoking ongoing with inside the household.  Objective:   Vitals:   08/11/21 1405  BP: 118/84  Pulse: (!) 102  Resp: 18  SpO2: 98%   Height: '5\' 1"'$  (154.9 cm) Weight: 120 lb 3.2 oz (54.5 kg)  Physical Exam Constitutional:      Appearance: She is not diaphoretic.  HENT:     Head: Normocephalic.     Right Ear: Tympanic membrane, ear canal and external ear normal.     Left Ear: Tympanic membrane, ear canal and external ear normal.     Nose: Nose normal. No mucosal edema or rhinorrhea.     Mouth/Throat:     Pharynx: Uvula midline. No oropharyngeal exudate.  Eyes:     Conjunctiva/sclera: Conjunctivae normal.  Neck:     Thyroid: No thyromegaly.     Trachea: Trachea normal. No tracheal tenderness or tracheal deviation.  Cardiovascular:     Rate and Rhythm: Normal rate and regular rhythm.     Heart sounds: Normal heart sounds, S1  normal and S2 normal. No murmur heard. Pulmonary:     Effort: No respiratory distress.     Breath sounds: Normal breath sounds. No stridor. No wheezing or rales.  Lymphadenopathy:     Head:     Right side of head: No tonsillar adenopathy.     Left side of head: No  tonsillar adenopathy.     Cervical: No cervical adenopathy.  Skin:    Findings: No erythema or rash.     Nails: There is no clubbing.  Neurological:     Mental Status: She is alert.     Diagnostics: Allergy skin tests were performed.  She did not demonstrate any hypersensitivity against a screening panel of aeroallergens.  Results of audiometry performed 08 May 2020 identifies the following:  Pure tone audiometry reveals normal hearing in both ears in the low frequencies with symmetric down-sloping to moderately severe high frequency loss. Speech discrimination is 92% bilaterally. Tympanograms are type A bilaterally.  Results of ENT evaluation, Dr. Melida Quitter, performed 08 May 2020,  identified the following physical exam findings:  Left ear: external ear normal, external auditory canal without substantial cerumen, tympanic membrane intact, middle ear aerated Right ear: external ear normal, external auditory canal without substantial cerumen, tympanic membrane intact, middle ear aerated  Assessment and Plan:    1. Dysfunction of both eustachian tubes   2. Tinnitus of both ears   3. Chronic rhinitis   4. Adverse effect of drug, initial encounter    1. Allergen avoidance measures???  2. Discontinue atorvastatin for the next 4 weeks  2. Treat and prevent inflammation:  A. OTC Budesonide - 1 spray each nostril 1 time per day B. Montelukast 10 mg - 1 tablet 1 time per day  3. Return to clinic in 4 weeks or earlier if problem  4. 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  It is interesting to note that her ear sensation became much worse while using atorvastatin and given the slim possibility that she is actually having an adverse side effect to the use of this medication we will have her discontinue this statin for the next 4 weeks.  I have asked her to utilize nasal budesonide and montelukast for 4 weeks to see if we can eliminate any inflammation of her eustachian tubes.  There was some significant hesitation about utilizing this form of therapy as Glenda Matthews and her daughter wanted a more definitive approach to investigate the cause of her problems before initiating therapy.  I informed Glenda Matthews and her daughter that the response to this treatment plan is the test that needs to be performed as there is no other additional test available to further investigate this issue at this point.  I will see her back in this clinic in 4 weeks or earlier if there is a problem.  Jiles Prows, MD Allergy / Immunology [facility]

## 2021-08-11 NOTE — Patient Instructions (Addendum)
  1. Allergen avoidance measures???  2. Discontinue atorvastatin for the next 4 weeks  2. Treat and prevent inflammation:  A. OTC Budesonide - 1 spray each nostril 1 time per day B. Montelukast 10 mg - 1 tablet 1 time per day  3. Return to clinic in 4 weeks or earlier if problem  4. Plan for fall flu vaccine and RSV vaccine

## 2021-08-11 NOTE — Progress Notes (Signed)
Office Visit Note  Patient: Glenda Matthews             Date of Birth: [birth year]           MRN: [id number]             PCP: Bernerd Limbo, MD Referring: Ernestene Kiel, MD Visit Date: 08/25/2021 Occupation: '@GUAROCC'$ @  Subjective:  Medication management  History of Present Illness: [name] is a 76 y.o. female with history of osteoporosis and osteoarthritis.  She returns today after her last visit in October 2022.  She states she had a detailed discussion with her daughter and she decided not to proceed with IV Reclast.  She does not want any treatment for osteoporosis at this time.  She would like to have a repeat DEXA scan to make further decisions.  She has been taking calcium and vitamin D on a regular basis.  She continues to have discomfort in her bilateral hands and her bilateral feet due to osteoarthritis.  She has intermittent pain in the trochanteric bursa.  She has discomfort in her entire spine due to arthritis.  She states the pain is manageable.  Activities of Daily Living:  Patient reports morning stiffness for a few minutes.   Patient Reports nocturnal pain.  Difficulty dressing/grooming: Denies Difficulty climbing stairs: Denies Difficulty getting out of chair: Reports Difficulty using hands for taps, buttons, cutlery, and/or writing: Reports  Review of Systems  Constitutional:  Negative for fatigue.  HENT:  Negative for mouth sores and mouth dryness.   Eyes:  Negative for dryness.  Respiratory:  Negative for shortness of breath.   Cardiovascular:  Negative for chest pain and palpitations.  Gastrointestinal:  Negative for blood in stool, constipation and diarrhea.  Endocrine: Negative for increased urination.  Genitourinary:  Negative for involuntary urination.  Musculoskeletal:  Positive for joint pain, joint pain and morning stiffness. Negative for gait problem, joint swelling, myalgias, muscle weakness, muscle tenderness and myalgias.  Skin:   Negative for color change, rash, hair loss and sensitivity to sunlight.  Allergic/Immunologic: Negative for susceptible to infections.  Neurological:  Negative for dizziness and headaches.  Hematological:  Negative for swollen glands.  Psychiatric/Behavioral:  Positive for sleep disturbance. Negative for depressed mood. The patient is not nervous/anxious.     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   Pressure sensation in both ears 05/19/2021   Age-related osteoporosis without current pathological fracture 04/30/2020   Primary osteoarthritis of both hands 04/30/2020   Primary osteoarthritis of both feet 04/30/2020   Chronic SI joint pain 04/30/2020   DDD (degenerative disc disease), thoracic 04/30/2020   DDD (degenerative disc disease), lumbar 04/30/2020   Hepatic cyst 04/30/2020   Renal cyst 04/30/2020   History of diverticulosis 04/30/2020   History of kidney stones 04/30/2020   Aortic atherosclerosis ([place]) 04/30/2020   Lung nodule 04/30/2020    Past Medical History:  Diagnosis Date   Anemia    none in recent years   Arthritis    shoulders   Brain cyst    brainstem   Chronic kidney disease    kidney stone   Diverticulosis    Headache(784.0)    Liver cyst    Osteoarthritis    Sciatica     Family History  Problem Relation Age of Onset   Cancer Mother        breast   Seizures Neg  Hx    Past Surgical History:  Procedure Laterality Date   ABDOMINAL HYSTERECTOMY     BASAL CELL CARCINOMA EXCISION  12/2017   BRAIN SURGERY  1998   shunt   BRAIN SURGERY  1998   brainstem cyst removed   CHOLECYSTECTOMY     gall bladder     skin caner     TUBAL LIGATION     tubialligation     Social History   Social History Narrative   Daughter stays with her    Right handed   Caffeine: decaf coffee very little tea     There is no immunization history on file for this patient.    Objective: Vital Signs: BP (!) 143/86 (BP Location: Left Arm, Patient Position: Sitting, Cuff Size: Normal)   Pulse 84   Resp 15   Ht '5\' 2"'$  (1.575 m)   Wt 119 lb (54 kg)   BMI 21.77 kg/m    Physical Exam Vitals and nursing note reviewed.  Constitutional:      Appearance: She is well-developed.  HENT:     Head: Normocephalic and atraumatic.  Eyes:     Conjunctiva/sclera: Conjunctivae normal.  Cardiovascular:     Rate and Rhythm: Normal rate and regular rhythm.     Heart sounds: Normal heart sounds.  Pulmonary:     Effort: Pulmonary effort is normal.     Breath sounds: Normal breath sounds.  Abdominal:     General: Bowel sounds are normal.     Palpations: Abdomen is soft.  Musculoskeletal:     Cervical back: Normal range of motion.  Lymphadenopathy:     Cervical: No cervical adenopathy.  Skin:    General: Skin is warm and dry.     Capillary Refill: Capillary refill takes less than 2 seconds.  Neurological:     Mental Status: She is alert and oriented to person, place, and time.  Psychiatric:        Behavior: Behavior normal.      Musculoskeletal Exam: She had limited lateral rotation of the cervical spine.  She had no tenderness over thoracic or lumbar spine.  Shoulder joints, elbow joints, wrist joints with good range of motion.  She had bilateral PIP and DIP thickening with no synovitis.  Hip joints and knee joints with good range of motion without any warmth swelling or effusion.  There was no tenderness over ankles or MTPs.  CDAI Exam: CDAI Score: -- Patient Global: --; Provider Global: -- Swollen: --; Tender: -- Joint Exam 08/25/2021   No joint exam has been documented for this visit   There is currently no information documented on the homunculus. Go to the Rheumatology activity and complete the homunculus joint exam.  Investigation: No additional findings.  Imaging: No results found.  Recent Labs: Lab Results  Component Value Date   WBC 11.7 (H)  01/23/2012   HGB 15.2 (H) 01/23/2012   PLT 233 01/23/2012   NA 142 01/16/2020   K 4.3 01/16/2020   CL 104 01/16/2020   CO2 28 01/16/2020   GLUCOSE 95 01/16/2020   BUN 14 01/16/2020   CREATININE 0.68 01/16/2020   BILITOT 0.6 01/16/2020   ALKPHOS 106 01/23/2012   AST 11 01/16/2020   ALT 14 01/16/2020   PROT 7.0 01/16/2020   PROT 6.8 01/16/2020   ALBUMIN 4.4 01/23/2012   CALCIUM 9.7 01/16/2020   GFRAA 98 01/16/2020    Speciality Comments: No specialty comments available.  Procedures:  No procedures performed Allergies: Other  Assessment / Plan:     Visit Diagnoses: Age-related osteoporosis without current pathological fracture - DEXA on 10/09/19: BMD measured at L FN 0.643 with a T-score of -2.8.  No previous DEXA are available for comparison.  She is treatment nave.  We had a detailed discussion regarding IV Reclast at the last visit.  Patient states that she reviewed the situation with her daughter and decided against it.  She does not want any treatment for osteoporosis at this time.  She plans to have a repeat DEXA scan through her PCP towards the end of this year.  Patient would prefer to return after 1 year.  I advised her to come sooner if she wants to initiate treatment for osteoporosis.  Vitamin D deficiency-she has been taking vitamin D 1000 units daily.  Need for regular exercise was emphasized.  Primary osteoarthritis of both hands -she continues to have pain and discomfort in her bilateral hands.  No synovitis was noted.  U/s of bilateral hands 06/09/2017 negative for synovitis. Bilateral median nerves WNL.  Joint protection muscle strengthening was discussed.  Rheumatoid factor positive - She has no clinical features of rheumatoid arthritis at this time.  She was advised to contact us if she develops any swelling.  Primary osteoarthritis of both feet-she continues to have some discomfort in the balls of her feet.  Proper fitting shoes were advised.  Trochanteric  bursitis of both hips-IT band stretches were demonstrated.  DDD (degenerative disc disease), thoracic-stretching exercises were discussed.  She had no point tenderness.  DDD (degenerative disc disease), lumbar-core strengthening exercises were discussed.  Acute pain of left shoulder-resolved.  Family history of psoriasis - Grandfather  Renal cyst  Hepatic cyst  History of diverticulosis  Lung nodule  Aortic atherosclerosis ([place])  History of kidney stones  Orders: No orders of the defined types were placed in this encounter.  No orders of the defined types were placed in this encounter.    Follow-Up Instructions: Return in about 1 year (around 08/26/2022) for Osteoporosis, Osteoarthritis.   Bo Merino, MD  Note - This record has been created using Editor, commissioning.  Chart creation errors have been sought, but may not always  have been located. Such creation errors do not reflect on  the standard of medical care.

## 2021-08-13 ENCOUNTER — Telehealth: Payer: Self-pay | Admitting: Neurology

## 2021-08-13 NOTE — Telephone Encounter (Signed)
I called daughter, DARE.  Pt saw allergist in [place], not impressed.  (Did no other testing, other recommend prednisone).  She would like a second opinion   She is ok to see another allergist or if you think ENT,  in [place], pt is fine with this.  She is not taking the prednisone.

## 2021-08-13 NOTE — Telephone Encounter (Signed)
Pt daughter ( Dare) is calling and said she has a quick question for the nurse. Dare didn't tell me what the question was. Dare is requesting a return call.

## 2021-08-13 NOTE — Telephone Encounter (Signed)
I called and LVM for daughter to return call.

## 2021-08-18 NOTE — Telephone Encounter (Signed)
I called daughter of pt.  I relayed that pcp would be best person to contact for ENT recommendation.  She asked about stopping topamax.  I relayed that she is on low dose '25mg'$  po qhs topiramate.  If she felt that taking steroid spray might be more helpful with ET dysfunction then the topamax.  I told her that since low dose can stop and see how she does on only nasal steroid spray. If finds that is not helping can restart the topiramate.  She verbalized understanding.

## 2021-08-22 ENCOUNTER — Telehealth: Payer: Self-pay | Admitting: Neurology

## 2021-08-22 NOTE — Telephone Encounter (Signed)
Pt is calling and requesting a nurse give her a call about her medication and they she is feeling right now.

## 2021-08-25 ENCOUNTER — Ambulatory Visit: Payer: Medicare Other | Attending: Rheumatology | Admitting: Rheumatology

## 2021-08-25 ENCOUNTER — Encounter: Payer: Self-pay | Admitting: Rheumatology

## 2021-08-25 VITALS — BP 143/86 | HR 84 | Resp 15 | Ht 62.0 in | Wt 119.0 lb

## 2021-08-25 DIAGNOSIS — M5136 Other intervertebral disc degeneration, lumbar region: Secondary | ICD-10-CM | POA: Diagnosis not present

## 2021-08-25 DIAGNOSIS — M7061 Trochanteric bursitis, right hip: Secondary | ICD-10-CM

## 2021-08-25 DIAGNOSIS — M19071 Primary osteoarthritis, right ankle and foot: Secondary | ICD-10-CM | POA: Diagnosis not present

## 2021-08-25 DIAGNOSIS — M7062 Trochanteric bursitis, left hip: Secondary | ICD-10-CM | POA: Diagnosis not present

## 2021-08-25 DIAGNOSIS — Z87442 Personal history of urinary calculi: Secondary | ICD-10-CM | POA: Diagnosis not present

## 2021-08-25 DIAGNOSIS — M19072 Primary osteoarthritis, left ankle and foot: Secondary | ICD-10-CM | POA: Insufficient documentation

## 2021-08-25 DIAGNOSIS — M19041 Primary osteoarthritis, right hand: Secondary | ICD-10-CM | POA: Diagnosis not present

## 2021-08-25 DIAGNOSIS — M5134 Other intervertebral disc degeneration, thoracic region: Secondary | ICD-10-CM

## 2021-08-25 DIAGNOSIS — E559 Vitamin D deficiency, unspecified: Secondary | ICD-10-CM | POA: Diagnosis not present

## 2021-08-25 DIAGNOSIS — M25512 Pain in left shoulder: Secondary | ICD-10-CM

## 2021-08-25 DIAGNOSIS — R768 Other specified abnormal immunological findings in serum: Secondary | ICD-10-CM | POA: Diagnosis not present

## 2021-08-25 DIAGNOSIS — N281 Cyst of kidney, acquired: Secondary | ICD-10-CM | POA: Diagnosis not present

## 2021-08-25 DIAGNOSIS — I700 Atherosclerosis of aorta: Secondary | ICD-10-CM

## 2021-08-25 DIAGNOSIS — Z8719 Personal history of other diseases of the digestive system: Secondary | ICD-10-CM | POA: Diagnosis not present

## 2021-08-25 DIAGNOSIS — M19042 Primary osteoarthritis, left hand: Secondary | ICD-10-CM | POA: Diagnosis not present

## 2021-08-25 DIAGNOSIS — K7689 Other specified diseases of liver: Secondary | ICD-10-CM | POA: Diagnosis not present

## 2021-08-25 DIAGNOSIS — M810 Age-related osteoporosis without current pathological fracture: Secondary | ICD-10-CM | POA: Diagnosis not present

## 2021-08-25 DIAGNOSIS — M51369 Other intervertebral disc degeneration, lumbar region without mention of lumbar back pain or lower extremity pain: Secondary | ICD-10-CM

## 2021-08-25 DIAGNOSIS — Z840 Family history of diseases of the skin and subcutaneous tissue: Secondary | ICD-10-CM

## 2021-08-25 DIAGNOSIS — R911 Solitary pulmonary nodule: Secondary | ICD-10-CM

## 2021-08-25 NOTE — Telephone Encounter (Signed)
I called pt and LMVM for her to return call when she can. (Following up on Friday message).

## 2021-08-26 NOTE — Telephone Encounter (Signed)
I called the pt. She states the medication that Dr Carmelina Peal put her on brought her symptoms back so she stopped it. She asked if she needed to restart her Topamax since she had stopped it. Right now she feels ok. Still has some fuzziness in ears. States her allergy tests were negative.

## 2021-08-26 NOTE — Telephone Encounter (Signed)
Per Dr Jaynee Eagles: She can restart the daily topiramate '25mg'$  thanks   I called the pt & LVM (Ok per [facility]) advising per Dr Jaynee Eagles, patient can restart the Topiramate 25 mg daily. Left office number for call back if she has any questions.

## 2021-09-11 ENCOUNTER — Ambulatory Visit: Payer: Medicare Other | Admitting: Allergy and Immunology

## 2021-11-10 DIAGNOSIS — Z23 Encounter for immunization: Secondary | ICD-10-CM | POA: Diagnosis not present

## 2021-11-10 DIAGNOSIS — Z0000 Encounter for general adult medical examination without abnormal findings: Secondary | ICD-10-CM | POA: Diagnosis not present

## 2021-11-10 DIAGNOSIS — Q019 Encephalocele, unspecified: Secondary | ICD-10-CM | POA: Diagnosis not present

## 2021-11-10 DIAGNOSIS — E119 Type 2 diabetes mellitus without complications: Secondary | ICD-10-CM | POA: Diagnosis not present

## 2021-11-10 DIAGNOSIS — E1169 Type 2 diabetes mellitus with other specified complication: Secondary | ICD-10-CM | POA: Diagnosis not present

## 2021-11-10 DIAGNOSIS — E785 Hyperlipidemia, unspecified: Secondary | ICD-10-CM | POA: Diagnosis not present

## 2021-11-10 DIAGNOSIS — M810 Age-related osteoporosis without current pathological fracture: Secondary | ICD-10-CM | POA: Diagnosis not present

## 2021-11-10 DIAGNOSIS — Z1231 Encounter for screening mammogram for malignant neoplasm of breast: Secondary | ICD-10-CM | POA: Diagnosis not present

## 2021-11-10 DIAGNOSIS — I700 Atherosclerosis of aorta: Secondary | ICD-10-CM | POA: Diagnosis not present

## 2021-11-10 DIAGNOSIS — Z982 Presence of cerebrospinal fluid drainage device: Secondary | ICD-10-CM | POA: Diagnosis not present

## 2021-12-01 ENCOUNTER — Telehealth: Payer: Self-pay | Admitting: Neurology

## 2021-12-01 ENCOUNTER — Encounter: Payer: Self-pay | Admitting: Neurology

## 2021-12-01 NOTE — Telephone Encounter (Signed)
Sent letter to inform pt of appointment change from 08/13/22 to 08/19/22 due to provider template change

## 2022-01-05 HISTORY — PX: SKIN BIOPSY: SHX1

## 2022-01-05 HISTORY — PX: SKIN CANCER EXCISION: SHX779

## 2022-04-01 DIAGNOSIS — Z1330 Encounter for screening examination for mental health and behavioral disorders, unspecified: Secondary | ICD-10-CM | POA: Diagnosis not present

## 2022-04-01 DIAGNOSIS — K121 Other forms of stomatitis: Secondary | ICD-10-CM | POA: Diagnosis not present

## 2022-04-15 DIAGNOSIS — L821 Other seborrheic keratosis: Secondary | ICD-10-CM | POA: Diagnosis not present

## 2022-04-15 DIAGNOSIS — D398 Neoplasm of uncertain behavior of other specified female genital organs: Secondary | ICD-10-CM | POA: Diagnosis not present

## 2022-04-23 DIAGNOSIS — S3140XA Unspecified open wound of vagina and vulva, initial encounter: Secondary | ICD-10-CM | POA: Diagnosis not present

## 2022-04-24 DIAGNOSIS — Z1231 Encounter for screening mammogram for malignant neoplasm of breast: Secondary | ICD-10-CM | POA: Diagnosis not present

## 2022-04-24 DIAGNOSIS — E785 Hyperlipidemia, unspecified: Secondary | ICD-10-CM | POA: Diagnosis not present

## 2022-04-24 DIAGNOSIS — E119 Type 2 diabetes mellitus without complications: Secondary | ICD-10-CM | POA: Diagnosis not present

## 2022-04-24 DIAGNOSIS — Z780 Asymptomatic menopausal state: Secondary | ICD-10-CM | POA: Diagnosis not present

## 2022-04-24 DIAGNOSIS — E1169 Type 2 diabetes mellitus with other specified complication: Secondary | ICD-10-CM | POA: Diagnosis not present

## 2022-05-21 DIAGNOSIS — L989 Disorder of the skin and subcutaneous tissue, unspecified: Secondary | ICD-10-CM | POA: Diagnosis not present

## 2022-05-21 DIAGNOSIS — Z7189 Other specified counseling: Secondary | ICD-10-CM | POA: Diagnosis not present

## 2022-05-21 DIAGNOSIS — Z08 Encounter for follow-up examination after completed treatment for malignant neoplasm: Secondary | ICD-10-CM | POA: Diagnosis not present

## 2022-05-21 DIAGNOSIS — D485 Neoplasm of uncertain behavior of skin: Secondary | ICD-10-CM | POA: Diagnosis not present

## 2022-05-21 DIAGNOSIS — Z85828 Personal history of other malignant neoplasm of skin: Secondary | ICD-10-CM | POA: Diagnosis not present

## 2022-05-21 DIAGNOSIS — L814 Other melanin hyperpigmentation: Secondary | ICD-10-CM | POA: Diagnosis not present

## 2022-05-21 DIAGNOSIS — R238 Other skin changes: Secondary | ICD-10-CM | POA: Diagnosis not present

## 2022-05-21 DIAGNOSIS — L538 Other specified erythematous conditions: Secondary | ICD-10-CM | POA: Diagnosis not present

## 2022-05-21 DIAGNOSIS — R208 Other disturbances of skin sensation: Secondary | ICD-10-CM | POA: Diagnosis not present

## 2022-05-21 DIAGNOSIS — L821 Other seborrheic keratosis: Secondary | ICD-10-CM | POA: Diagnosis not present

## 2022-05-21 DIAGNOSIS — D225 Melanocytic nevi of trunk: Secondary | ICD-10-CM | POA: Diagnosis not present

## 2022-05-26 DIAGNOSIS — D485 Neoplasm of uncertain behavior of skin: Secondary | ICD-10-CM | POA: Diagnosis not present

## 2022-06-26 DIAGNOSIS — C44619 Basal cell carcinoma of skin of left upper limb, including shoulder: Secondary | ICD-10-CM | POA: Diagnosis not present

## 2022-06-26 DIAGNOSIS — D485 Neoplasm of uncertain behavior of skin: Secondary | ICD-10-CM | POA: Diagnosis not present

## 2022-06-26 DIAGNOSIS — C44719 Basal cell carcinoma of skin of left lower limb, including hip: Secondary | ICD-10-CM | POA: Diagnosis not present

## 2022-07-07 DIAGNOSIS — C44619 Basal cell carcinoma of skin of left upper limb, including shoulder: Secondary | ICD-10-CM | POA: Diagnosis not present

## 2022-07-07 DIAGNOSIS — C44719 Basal cell carcinoma of skin of left lower limb, including hip: Secondary | ICD-10-CM | POA: Diagnosis not present

## 2022-07-14 DIAGNOSIS — Z48817 Encounter for surgical aftercare following surgery on the skin and subcutaneous tissue: Secondary | ICD-10-CM | POA: Diagnosis not present

## 2022-07-14 DIAGNOSIS — C44519 Basal cell carcinoma of skin of other part of trunk: Secondary | ICD-10-CM | POA: Diagnosis not present

## 2022-07-21 DIAGNOSIS — Z48817 Encounter for surgical aftercare following surgery on the skin and subcutaneous tissue: Secondary | ICD-10-CM | POA: Diagnosis not present

## 2022-08-13 ENCOUNTER — Ambulatory Visit: Payer: Medicare Other | Admitting: Neurology

## 2022-08-18 NOTE — Progress Notes (Deleted)
Office Visit Note  Patient: Glenda Matthews             Date of Birth: 04-28-1945           MRN: [id number]             PCP: Tracey Harries, MD Referring: Tracey Harries, MD Visit Date: 08/27/2022 Occupation: @GUAROCC @  Subjective:  No chief complaint on file.   History of Present Illness: [place] is a 77 y.o. female ***     Activities of Daily Living:  Patient reports morning stiffness for *** {minute/hour:19697}.   Patient {ACTIONS;DENIES/REPORTS:21021675::"Denies"} nocturnal pain.  Difficulty dressing/grooming: {ACTIONS;DENIES/REPORTS:21021675::"Denies"} Difficulty climbing stairs: {ACTIONS;DENIES/REPORTS:21021675::"Denies"} Difficulty getting out of chair: {ACTIONS;DENIES/REPORTS:21021675::"Denies"} Difficulty using hands for taps, buttons, cutlery, and/or writing: {ACTIONS;DENIES/REPORTS:21021675::"Denies"}  No Rheumatology ROS completed.   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   Pressure sensation in both ears 05/19/2021   Age-related osteoporosis without current pathological fracture 04/30/2020   Primary osteoarthritis of both hands 04/30/2020   Primary osteoarthritis of both feet 04/30/2020   Chronic SI joint pain 04/30/2020   DDD (degenerative disc disease), thoracic 04/30/2020   DDD (degenerative disc disease), lumbar 04/30/2020   Hepatic cyst 04/30/2020   Renal cyst 04/30/2020   History of diverticulosis 04/30/2020   History of kidney stones 04/30/2020   Aortic atherosclerosis (HCC) 04/30/2020   Lung nodule 04/30/2020    Past Medical History:  Diagnosis Date   Anemia    none in recent years   Arthritis    shoulders   Brain cyst    brainstem   Chronic kidney disease    kidney stone   Diverticulosis    Headache(784.0)    Liver cyst    Osteoarthritis    Sciatica     Family History  Problem Relation Age of Onset   Cancer Mother        breast    Seizures Neg Hx    Past Surgical History:  Procedure Laterality Date   ABDOMINAL HYSTERECTOMY     BASAL CELL CARCINOMA EXCISION  12/2017   BRAIN SURGERY  1998   shunt   BRAIN SURGERY  1998   brainstem cyst removed   CHOLECYSTECTOMY     gall bladder     skin caner     TUBAL LIGATION     tubialligation     Social History   Social History Narrative   Daughter stays with her    Right handed   Caffeine: decaf coffee very little tea     There is no immunization history on file for this patient.   Objective: Vital Signs: There were no vitals taken for this visit.   Physical Exam   Musculoskeletal Exam: ***  CDAI Exam: CDAI Score: -- Patient Global: --; Provider Global: -- Swollen: --; Tender: -- Joint Exam 08/27/2022   No joint exam has been documented for this visit   There is currently no information documented on the homunculus. Go to the Rheumatology activity and complete the homunculus joint exam.  Investigation: No additional findings.  Imaging: No results found.  Recent Labs: Lab Results  Component Value Date   WBC 11.7 (H) 01/23/2012   HGB 15.2 (H) 01/23/2012   PLT 233 01/23/2012   NA 142 01/16/2020   K 4.3 01/16/2020   CL 104 01/16/2020   CO2 28 01/16/2020   GLUCOSE 95 01/16/2020   BUN 14 01/16/2020  CREATININE 0.68 01/16/2020   BILITOT 0.6 01/16/2020   ALKPHOS 106 01/23/2012   AST 11 01/16/2020   ALT 14 01/16/2020   PROT 7.0 01/16/2020   PROT 6.8 01/16/2020   ALBUMIN 4.4 01/23/2012   CALCIUM 9.7 01/16/2020   GFRAA 98 01/16/2020    Speciality Comments: No specialty comments available.  Procedures:  No procedures performed Allergies: Other   Assessment / Plan:     Visit Diagnoses: Age-related osteoporosis without current pathological fracture  Vitamin D deficiency  Primary osteoarthritis of both hands  Rheumatoid factor positive  Primary osteoarthritis of both feet  Trochanteric bursitis of both hips  DDD (degenerative disc  disease), thoracic  DDD (degenerative disc disease), lumbar  Family history of psoriasis  Renal cyst  Hepatic cyst  History of diverticulosis  Lung nodule  Aortic atherosclerosis (HCC)  History of kidney stones  Orders: No orders of the defined types were placed in this encounter.  No orders of the defined types were placed in this encounter.   Face-to-face time spent with patient was *** minutes. Greater than 50% of time was spent in counseling and coordination of care.  Follow-Up Instructions: No follow-ups on file.   Gearldine Bienenstock, PA-C  Note - This record has been created using Dragon software.  Chart creation errors have been sought, but may not always  have been located. Such creation errors do not reflect on  the standard of medical care.

## 2022-08-19 ENCOUNTER — Ambulatory Visit: Payer: Medicare Other | Admitting: Neurology

## 2022-08-26 DIAGNOSIS — Z48817 Encounter for surgical aftercare following surgery on the skin and subcutaneous tissue: Secondary | ICD-10-CM | POA: Diagnosis not present

## 2022-08-27 ENCOUNTER — Ambulatory Visit: Payer: Medicare Other | Admitting: Physician Assistant

## 2022-08-27 DIAGNOSIS — N281 Cyst of kidney, acquired: Secondary | ICD-10-CM

## 2022-08-27 DIAGNOSIS — Z840 Family history of diseases of the skin and subcutaneous tissue: Secondary | ICD-10-CM

## 2022-08-27 DIAGNOSIS — M810 Age-related osteoporosis without current pathological fracture: Secondary | ICD-10-CM

## 2022-08-27 DIAGNOSIS — I700 Atherosclerosis of aorta: Secondary | ICD-10-CM

## 2022-08-27 DIAGNOSIS — M5134 Other intervertebral disc degeneration, thoracic region: Secondary | ICD-10-CM

## 2022-08-27 DIAGNOSIS — K7689 Other specified diseases of liver: Secondary | ICD-10-CM

## 2022-08-27 DIAGNOSIS — M19072 Primary osteoarthritis, left ankle and foot: Secondary | ICD-10-CM

## 2022-08-27 DIAGNOSIS — Z87442 Personal history of urinary calculi: Secondary | ICD-10-CM

## 2022-08-27 DIAGNOSIS — E559 Vitamin D deficiency, unspecified: Secondary | ICD-10-CM

## 2022-08-27 DIAGNOSIS — R911 Solitary pulmonary nodule: Secondary | ICD-10-CM

## 2022-08-27 DIAGNOSIS — R768 Other specified abnormal immunological findings in serum: Secondary | ICD-10-CM

## 2022-08-27 DIAGNOSIS — M19042 Primary osteoarthritis, left hand: Secondary | ICD-10-CM

## 2022-08-27 DIAGNOSIS — M5136 Other intervertebral disc degeneration, lumbar region: Secondary | ICD-10-CM

## 2022-08-27 DIAGNOSIS — Z8719 Personal history of other diseases of the digestive system: Secondary | ICD-10-CM

## 2022-08-27 DIAGNOSIS — M7061 Trochanteric bursitis, right hip: Secondary | ICD-10-CM

## 2022-09-08 DIAGNOSIS — R92323 Mammographic fibroglandular density, bilateral breasts: Secondary | ICD-10-CM | POA: Diagnosis not present

## 2022-09-08 DIAGNOSIS — Z780 Asymptomatic menopausal state: Secondary | ICD-10-CM | POA: Diagnosis not present

## 2022-09-08 DIAGNOSIS — Z1231 Encounter for screening mammogram for malignant neoplasm of breast: Secondary | ICD-10-CM | POA: Diagnosis not present

## 2022-09-08 DIAGNOSIS — M8588 Other specified disorders of bone density and structure, other site: Secondary | ICD-10-CM | POA: Diagnosis not present

## 2022-09-08 DIAGNOSIS — M810 Age-related osteoporosis without current pathological fracture: Secondary | ICD-10-CM | POA: Diagnosis not present

## 2022-09-14 ENCOUNTER — Ambulatory Visit: Payer: Medicare Other | Attending: Rheumatology | Admitting: Physician Assistant

## 2022-09-14 ENCOUNTER — Encounter: Payer: Self-pay | Admitting: Physician Assistant

## 2022-09-14 VITALS — BP 166/94 | HR 81 | Resp 15 | Ht 61.0 in | Wt 126.4 lb

## 2022-09-14 DIAGNOSIS — Z8719 Personal history of other diseases of the digestive system: Secondary | ICD-10-CM | POA: Diagnosis not present

## 2022-09-14 DIAGNOSIS — M7061 Trochanteric bursitis, right hip: Secondary | ICD-10-CM | POA: Insufficient documentation

## 2022-09-14 DIAGNOSIS — M19072 Primary osteoarthritis, left ankle and foot: Secondary | ICD-10-CM | POA: Insufficient documentation

## 2022-09-14 DIAGNOSIS — I700 Atherosclerosis of aorta: Secondary | ICD-10-CM | POA: Diagnosis not present

## 2022-09-14 DIAGNOSIS — Z840 Family history of diseases of the skin and subcutaneous tissue: Secondary | ICD-10-CM | POA: Insufficient documentation

## 2022-09-14 DIAGNOSIS — R768 Other specified abnormal immunological findings in serum: Secondary | ICD-10-CM | POA: Diagnosis not present

## 2022-09-14 DIAGNOSIS — N281 Cyst of kidney, acquired: Secondary | ICD-10-CM | POA: Insufficient documentation

## 2022-09-14 DIAGNOSIS — M810 Age-related osteoporosis without current pathological fracture: Secondary | ICD-10-CM | POA: Insufficient documentation

## 2022-09-14 DIAGNOSIS — E559 Vitamin D deficiency, unspecified: Secondary | ICD-10-CM | POA: Diagnosis not present

## 2022-09-14 DIAGNOSIS — M19071 Primary osteoarthritis, right ankle and foot: Secondary | ICD-10-CM | POA: Insufficient documentation

## 2022-09-14 DIAGNOSIS — M19041 Primary osteoarthritis, right hand: Secondary | ICD-10-CM | POA: Insufficient documentation

## 2022-09-14 DIAGNOSIS — M5136 Other intervertebral disc degeneration, lumbar region: Secondary | ICD-10-CM | POA: Diagnosis not present

## 2022-09-14 DIAGNOSIS — M7062 Trochanteric bursitis, left hip: Secondary | ICD-10-CM | POA: Insufficient documentation

## 2022-09-14 DIAGNOSIS — M5134 Other intervertebral disc degeneration, thoracic region: Secondary | ICD-10-CM | POA: Diagnosis not present

## 2022-09-14 DIAGNOSIS — K7689 Other specified diseases of liver: Secondary | ICD-10-CM | POA: Diagnosis not present

## 2022-09-14 DIAGNOSIS — M19042 Primary osteoarthritis, left hand: Secondary | ICD-10-CM | POA: Insufficient documentation

## 2022-09-14 DIAGNOSIS — M25512 Pain in left shoulder: Secondary | ICD-10-CM | POA: Insufficient documentation

## 2022-09-14 DIAGNOSIS — R911 Solitary pulmonary nodule: Secondary | ICD-10-CM | POA: Diagnosis not present

## 2022-09-14 DIAGNOSIS — Z87442 Personal history of urinary calculi: Secondary | ICD-10-CM | POA: Insufficient documentation

## 2022-09-14 NOTE — Progress Notes (Signed)
Office Visit Note  Patient: Glenda Matthews             Date of Birth: 1945-04-03           MRN: [id number]             PCP: Tracey Harries, MD Referring: Tracey Harries, MD Visit Date: 09/14/2022 Occupation: @GUAROCC @  Subjective:  Routine follow up   History of Present Illness: [place] is a 77 y.o. female with history of osteoporosis and osteoarthritis. Patient was last seen in the office on 08/25/21.  Patient reports that she had a recent updated DEXA and mammogram ordered by her PCP.  Patient is hesitant to add any medications at this time.  She continues to take a vitamin D supplement.  She denies any recent falls or fractures.  Patient states she has had some increased muscle cramping in her legs at night.  She is also noticed fluid retention intermittently but has been more sedentary recently.  Activities of Daily Living:  Patient reports morning stiffness  Patient Reports nocturnal pain.  Difficulty dressing/grooming: Denies Difficulty climbing stairs: Denies Difficulty getting out of chair: Denies Difficulty using hands for taps, buttons, cutlery, and/or writing: Denies  Review of Systems  Constitutional:  Positive for fatigue.  HENT:  Positive for mouth dryness. Negative for mouth sores.   Eyes:  Negative for dryness.  Respiratory:  Negative for shortness of breath.   Cardiovascular:  Negative for chest pain and palpitations.  Gastrointestinal:  Negative for blood in stool, constipation and diarrhea.  Endocrine: Negative for increased urination.  Genitourinary:  Negative for involuntary urination.  Musculoskeletal:  Positive for joint pain, joint pain, joint swelling, myalgias, muscle weakness, morning stiffness and myalgias. Negative for gait problem and muscle tenderness.  Skin:  Positive for hair loss. Negative for color change, rash and sensitivity to sunlight.  Allergic/Immunologic: Negative for susceptible to infections.  Neurological:  Positive for  numbness. Negative for dizziness and headaches.  Hematological:  Negative for swollen glands.  Psychiatric/Behavioral:  Positive for sleep disturbance. Negative for depressed mood. The patient is nervous/anxious.     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   Pressure sensation in both ears 05/19/2021   Age-related osteoporosis without current pathological fracture 04/30/2020   Primary osteoarthritis of both hands 04/30/2020   Primary osteoarthritis of both feet 04/30/2020   Chronic SI joint pain 04/30/2020   DDD (degenerative disc disease), thoracic 04/30/2020   DDD (degenerative disc disease), lumbar 04/30/2020   Hepatic cyst 04/30/2020   Renal cyst 04/30/2020   History of diverticulosis 04/30/2020   History of kidney stones 04/30/2020   Aortic atherosclerosis (HCC) 04/30/2020   Lung nodule 04/30/2020    Past Medical History:  Diagnosis Date   Anemia    none in recent years   Arthritis    shoulders   Brain cyst    brainstem   Chronic kidney disease    kidney stone   Diverticulosis    Headache(784.0)    Liver cyst    Osteoarthritis    Sciatica     Family History  Problem Relation Age of Onset   Cancer Mother        breast   COPD Daughter    Seizures Neg Hx    Past Surgical History:  Procedure Laterality Date   ABDOMINAL HYSTERECTOMY     BASAL CELL CARCINOMA EXCISION  12/2017  BRAIN SURGERY  1998   shunt   BRAIN SURGERY  1998   brainstem cyst removed   CHOLECYSTECTOMY     gall bladder     SKIN BIOPSY  2024   numerous skin biopsies   SKIN CANCER EXCISION  2024   left shoulder and left leg   skin caner     TUBAL LIGATION     tubialligation     Social History   Social History Narrative   Daughter stays with her    Right handed   Caffeine: decaf coffee very little tea     There is no immunization history on file for this patient.   Objective: Vital Signs: BP (!)  166/94 (BP Location: Left Arm, Patient Position: Sitting, Cuff Size: Normal)   Pulse 81   Resp 15   Ht 5\' 1"  (1.549 m)   Wt 126 lb 6.4 oz (57.3 kg)   BMI 23.88 kg/m    Physical Exam Vitals and nursing note reviewed.  Constitutional:      Appearance: She is well-developed.  HENT:     Head: Normocephalic and atraumatic.  Eyes:     Conjunctiva/sclera: Conjunctivae normal.  Cardiovascular:     Rate and Rhythm: Normal rate and regular rhythm.     Heart sounds: Normal heart sounds.  Pulmonary:     Effort: Pulmonary effort is normal.     Breath sounds: Normal breath sounds.  Abdominal:     General: Bowel sounds are normal.     Palpations: Abdomen is soft.  Musculoskeletal:     Cervical back: Normal range of motion.  Lymphadenopathy:     Cervical: No cervical adenopathy.  Skin:    General: Skin is warm and dry.     Capillary Refill: Capillary refill takes less than 2 seconds.  Neurological:     Mental Status: She is alert and oriented to person, place, and time.  Psychiatric:        Behavior: Behavior normal.      Musculoskeletal Exam: C-spine limited ROM.  Shoulder joints, elbow joints, wrist joints, MCPs, PIPs, and DIPs good ROM with no synovitis.   PIP and DIP thickening consistent with osteoarthritis of both hands.  Hip joints have good ROM with no groin pain.  Knee joints have good ROM with no warmth or effusion.  Ankle joints have good ROM with no tenderness or joint swelling.  PIP and DIP thickening consistent with OA of both feet.  No synovitis noted over MTP joints.   CDAI Exam: CDAI Score: -- Patient Global: --; Provider Global: -- Swollen: --; Tender: -- Joint Exam 09/14/2022   No joint exam has been documented for this visit   There is currently no information documented on the homunculus. Go to the Rheumatology activity and complete the homunculus joint exam.  Investigation: No additional findings.  Imaging: No results found.  Recent Labs: Lab Results   Component Value Date   WBC 11.7 (H) 01/23/2012   HGB 15.2 (H) 01/23/2012   PLT 233 01/23/2012   NA 142 01/16/2020   K 4.3 01/16/2020   CL 104 01/16/2020   CO2 28 01/16/2020   GLUCOSE 95 01/16/2020   BUN 14 01/16/2020   CREATININE 0.68 01/16/2020   BILITOT 0.6 01/16/2020   ALKPHOS 106 01/23/2012   AST 11 01/16/2020   ALT 14 01/16/2020   PROT 7.0 01/16/2020   PROT 6.8 01/16/2020   ALBUMIN 4.4 01/23/2012   CALCIUM 9.7 01/16/2020   GFRAA 98 01/16/2020  Speciality Comments: No specialty comments available.  Procedures:  No procedures performed Allergies: Other   Assessment / Plan:     Visit Diagnoses: Age-related osteoporosis without current pathological fracture - DEXA on 10/09/19: BMD measured at LFN 0.643 with a T-score of -2.8.  No previous DEXA are available for comparison.  She is treatment nave.  Patient was last seen in the office on 08/25/2021 at which time the use of IV Reclast was discussed in detail.  The patient was apprehensive to add any medications and opted to continue vitamin D supplementation.  She remains hesitant to add any medications at this time.  Going to the patient she recently had an updated DEXA and mammogram ordered by her PCP.  She is planning on following up with her PCP in the future to discuss DEXA results.  She was vies notify us when and if she would like to return to discuss treatment options for osteoporosis management.  Vitamin D deficiency: She is taking a daily vitamin D supplement.  Primary osteoarthritis of both hands - U/s of bilateral hands 06/09/2017 negative for synovitis. Bilateral median nerves WNL.  PIP and DIP thickening.  No synovitis noted.  Rheumatoid factor positive - She has no clinical features of rheumatoid arthritis at this time.  Primary osteoarthritis of both feet: PIP and DIP thickening consistent with osteoarthritis of both feet.  No tenderness or synovitis over MTP joints.  Trochanteric bursitis of both hips: Not  currently symptomatic.  Good range of motion of both hip joints with no groin pain.  DDD (degenerative disc disease), thoracic: No midline spinal tenderness.  DDD (degenerative disc disease), lumbar: No midline spinal tenderness at this time.  Other medical conditions are listed as follows:   Acute pain of left shoulder - resolved.  Family history of psoriasis - Grandfather  Hepatic cyst  Renal cyst  Lung nodule  History of diverticulosis  Aortic atherosclerosis (HCC)  History of kidney stones  Orders: No orders of the defined types were placed in this encounter.  No orders of the defined types were placed in this encounter.     Follow-Up Instructions: Return if symptoms worsen or fail to improve, for Osteoporosis, Osteoarthritis.   Gearldine Bienenstock, PA-C  Note - This record has been created using Dragon software.  Chart creation errors have been sought, but may not always  have been located. Such creation errors do not reflect on  the standard of medical care.

## 2022-10-06 DIAGNOSIS — L905 Scar conditions and fibrosis of skin: Secondary | ICD-10-CM | POA: Diagnosis not present

## 2022-10-06 DIAGNOSIS — L708 Other acne: Secondary | ICD-10-CM | POA: Diagnosis not present

## 2022-11-16 DIAGNOSIS — M810 Age-related osteoporosis without current pathological fracture: Secondary | ICD-10-CM | POA: Diagnosis not present

## 2022-11-16 DIAGNOSIS — Z0000 Encounter for general adult medical examination without abnormal findings: Secondary | ICD-10-CM | POA: Diagnosis not present

## 2023-01-12 ENCOUNTER — Telehealth: Payer: Self-pay | Admitting: Neurology

## 2023-01-12 NOTE — Telephone Encounter (Signed)
 Pt rescheduled appointment due to daughter is sick and unable to bring to appointment.

## 2023-01-13 ENCOUNTER — Ambulatory Visit: Payer: Medicare Other | Admitting: Neurology

## 2023-03-31 ENCOUNTER — Ambulatory Visit: Payer: Medicare Other | Admitting: Neurology

## 2023-05-03 ENCOUNTER — Telehealth: Payer: Self-pay | Admitting: Neurology

## 2023-05-03 ENCOUNTER — Encounter: Payer: Self-pay | Admitting: Neurology

## 2023-05-03 NOTE — Telephone Encounter (Signed)
 LVM and sent letter in mail informing pt of need to reschedule 07/19/23 appt - MD out   If patient calls back to r/s you can offer an appointment at the end of August with Dr. Tresia Fruit

## 2023-07-19 ENCOUNTER — Ambulatory Visit: Admitting: Neurology

## 2023-08-31 NOTE — Progress Notes (Signed)
 Office Visit Note  Patient: Glenda Matthews             Date of Birth: [birth year]           MRN: [id number]             PCP: Pura Lenis, MD Referring: Pura Lenis, MD Visit Date: 09/14/2023 Occupation: @GUAROCC @  Subjective:  Discuss DEXA scan results  History of Present Illness: Glenda  D Matthews is a 78 y.o. female with osteoporosis and osteoarthritis.  She returns today after her last visit in September 2024.  She had hysterectomy in her 22s.  She took estrogen for 1 year.  She has never had a stress fracture or any other fractures.  There is no family history of osteoporosis or femoral fracture.  She has been on pantoprazole  for many years.  She has not been on Synthroid or seizure medications.  She reports some discomfort in her lower back, hands and her feet.    Activities of Daily Living:  Patient reports morning stiffness for 1 hour.   Patient Denies nocturnal pain.  Difficulty dressing/grooming: Reports Difficulty climbing stairs: Reports Difficulty getting out of chair: Reports Difficulty using hands for taps, buttons, cutlery, and/or writing: Denies  Review of Systems  Constitutional:  Positive for fatigue.  HENT:  Positive for hearing loss and mouth dryness. Negative for mouth sores.   Eyes:  Negative for dryness.  Respiratory:  Positive for shortness of breath.   Cardiovascular:  Positive for chest pain. Negative for palpitations.  Gastrointestinal:  Negative for blood in stool, constipation and diarrhea.  Endocrine: Negative for increased urination.  Genitourinary:  Negative for involuntary urination.  Musculoskeletal:  Positive for joint pain, gait problem, joint pain, joint swelling, myalgias, morning stiffness, muscle tenderness and myalgias. Negative for muscle weakness.  Skin:  Positive for color change and hair loss. Negative for rash and sensitivity to sunlight.  Allergic/Immunologic: Negative for susceptible to infections.  Neurological:  Positive for  numbness. Negative for dizziness and headaches.  Hematological:  Positive for bruising/bleeding tendency. Negative for swollen glands.  Psychiatric/Behavioral:  Negative for depressed mood and sleep disturbance. The patient is not nervous/anxious.     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   Pressure sensation in both ears 05/19/2021   Age-related osteoporosis without current pathological fracture 04/30/2020   Primary osteoarthritis of both hands 04/30/2020   Primary osteoarthritis of both feet 04/30/2020   Chronic SI joint pain 04/30/2020   DDD (degenerative disc disease), thoracic 04/30/2020   DDD (degenerative disc disease), lumbar 04/30/2020   Hepatic cyst 04/30/2020   Renal cyst 04/30/2020   History of diverticulosis 04/30/2020   History of kidney stones 04/30/2020   Aortic atherosclerosis (HCC) 04/30/2020   Lung nodule 04/30/2020    Past Medical History:  Diagnosis Date   Anemia    none in recent years   Arthritis    shoulders   Brain cyst    brainstem   Chronic kidney disease    kidney stone   Diverticulosis    Headache(784.0)    Liver cyst    Osteoarthritis    Sciatica     Family History  Problem Relation Age of Onset   Cancer Mother        breast   COPD Daughter    Seizures Neg Hx    Past Surgical History:  Procedure Laterality Date   ABDOMINAL HYSTERECTOMY  BASAL CELL CARCINOMA EXCISION  12/2017   BRAIN SURGERY  1998   shunt   BRAIN SURGERY  1998   brainstem cyst removed   CHOLECYSTECTOMY     gall bladder     SKIN BIOPSY  2024   numerous skin biopsies   SKIN CANCER EXCISION  2024   left shoulder and left leg   skin caner     TUBAL LIGATION     tubialligation     Social History   Social History Narrative   Daughter stays with her    Right handed   Caffeine: decaf coffee very little tea     There is no immunization history on file for this patient.    Objective: Vital Signs: BP (!) 162/81 (BP Location: Left Arm, Patient Position: Sitting, Cuff Size: Normal)   Pulse 76   Resp 14   Ht 5' 1 (1.549 m)   Wt 125 lb 9.6 oz (57 kg)   BMI 23.73 kg/m    Physical Exam Vitals and nursing note reviewed.  Constitutional:      Appearance: She is well-developed.  HENT:     Head: Normocephalic and atraumatic.  Eyes:     Conjunctiva/sclera: Conjunctivae normal.  Cardiovascular:     Rate and Rhythm: Normal rate and regular rhythm.     Heart sounds: Normal heart sounds.  Pulmonary:     Effort: Pulmonary effort is normal.     Breath sounds: Normal breath sounds.  Abdominal:     General: Bowel sounds are normal.     Palpations: Abdomen is soft.  Musculoskeletal:     Cervical back: Normal range of motion.  Lymphadenopathy:     Cervical: No cervical adenopathy.  Skin:    General: Skin is warm and dry.     Capillary Refill: Capillary refill takes less than 2 seconds.  Neurological:     Mental Status: She is alert and oriented to person, place, and time.  Psychiatric:        Behavior: Behavior normal.      Musculoskeletal Exam: Cervical spine was in good range of motion.  Shoulders, elbows, wrist, MCPs were in good range of motion.  She had bilateral PIP and DIP thickening with no synovitis.  Hip joints and knee joints with good range of motion without any warmth swelling or effusion.  There was no tenderness over ankles or MTPs.  CDAI Exam: CDAI Score: -- Patient Global: --; Provider Global: -- Swollen: --; Tender: -- Joint Exam 09/14/2023   No joint exam has been documented for this visit   There is currently no information documented on the homunculus. Go to the Rheumatology activity and complete the homunculus joint exam.  Investigation: No additional findings.  Imaging: No results found.  Recent Labs: Lab Results  Component Value Date   WBC 11.7 (H) 01/23/2012   HGB 15.2 (H) 01/23/2012   PLT 233 01/23/2012   NA 142  01/16/2020   K 4.3 01/16/2020   CL 104 01/16/2020   CO2 28 01/16/2020   GLUCOSE 95 01/16/2020   BUN 14 01/16/2020   CREATININE 0.68 01/16/2020   BILITOT 0.6 01/16/2020   ALKPHOS 106 01/23/2012   AST 11 01/16/2020   ALT 14 01/16/2020   PROT 7.0 01/16/2020   PROT 6.8 01/16/2020   ALBUMIN 4.4 01/23/2012   CALCIUM 9.7 01/16/2020   GFRAA 98 01/16/2020    Speciality Comments: No specialty comments available.  Procedures:  No procedures performed Allergies: Other   Assessment /  Plan:     Visit Diagnoses: Age-related osteoporosis without current pathological fracture -she has history of osteoporosis.  She declined treatment in the past.  She has been taking vitamin D  but does not take any calcium.  She states she walks for exercise and also is very active.  She was accompanied by her daughter today.  DEXA on 10/09/19: BMD measured at LFN 0.643 with a T-score of -2.8.  Mathew Lynwood POUR, MD - 09/09/2022  Formatting of this note might be different from the original.  INDICATION: Postmenopausal state   TECHNIQUE:  Standard bone densitometry was performed.  FINDINGS:       LUMBAR SPINE:  The bone mineral density of the lumbar spine from L1 through L4 is 0.946 g/cm2.  T-score: -2.0  Z-score: 0.2  Additional comments/Omitted levels: None.  Comparison: No comparison.   PROXIMAL FEMUR:  The bone mineral density as measured at the left femoral neck region of interest is 0.590 g/cm2.  T-score: -3.2  Z-score: -0.9  Comparison: No comparison.   Additional sites: None  The above DEXA scan results were discussed with the patient and her daughter at length.  Different treatment options and their side effects were discussed including the risk of osteonecrosis of the jaw and atypical fracture.  Risk of infusion reaction was also discussed with IV Reclast.  Patient reports that she will be getting a tooth extraction.  She would like to hold off treatment until tooth extraction.  I discussed the  option of IV Reclast or subcu Prolia.  Side effects of both medications were discussed.  If her GFR is normal then we may consider IV Reclast.  Patient will contact us  after the tooth extraction.   Vitamin D  deficiency-I will check vitamin D  level today.  She states she has been taking some vitamin D .  Primary osteoarthritis of both hands -she complains of a stiffness in her hands.  No synovitis was noted on the examination today.  PIP and DIP thickening was noted suggestive of osteoarthritis.  Joint protection muscle strengthening was discussed.  U/s of bilateral hands 06/09/2017 negative for synovitis. Bilateral median nerves WNL.  Rheumatoid factor positive -no synovitis was noted on the examination.  Primary osteoarthritis of both feet-she complains of discomfort in her feet.  No synovitis was noted on the examination.  Trochanteric bursitis of both hips-currently asymptomatic.  DDD (degenerative disc disease), thoracic-chronic discomfort.  Spondylosis of lumbar spine-chronic pain.  Stretching exercises were discussed.  Acute pain of left shoulder - resolved.  Family history of psoriasis - Grandfather  Hepatic cyst  Renal cyst  Lung nodule  Aortic atherosclerosis (HCC)  History of diverticulosis  History of kidney stones  Orders: Orders Placed This Encounter  Procedures   CBC with Differential/Platelet   Comprehensive metabolic panel with GFR   Parathyroid  hormone, intact (no Ca)   VITAMIN D  25 Hydroxy (Vit-D Deficiency, Fractures)   Phosphorus   Serum protein electrophoresis with reflex   TSH   No orders of the defined types were placed in this encounter.   Face-to-face time spent with patient was 35 minutes. Greater than 50% of time was spent in counseling and coordination of care.  Follow-Up Instructions: Return in about 6 months (around 03/13/2024) for Osteoporosis.   Maya Nash, MD  Note - This record has been created using Animal nutritionist.  Chart  creation errors have been sought, but may not always  have been located. Such creation errors do not reflect on  the standard of medical  care.

## 2023-09-14 ENCOUNTER — Ambulatory Visit: Payer: Medicare Other | Attending: Rheumatology | Admitting: Rheumatology

## 2023-09-14 ENCOUNTER — Encounter: Payer: Self-pay | Admitting: Rheumatology

## 2023-09-14 VITALS — BP 162/81 | HR 76 | Resp 14 | Ht 61.0 in | Wt 125.6 lb

## 2023-09-14 DIAGNOSIS — K7689 Other specified diseases of liver: Secondary | ICD-10-CM

## 2023-09-14 DIAGNOSIS — M5134 Other intervertebral disc degeneration, thoracic region: Secondary | ICD-10-CM | POA: Diagnosis present

## 2023-09-14 DIAGNOSIS — M19071 Primary osteoarthritis, right ankle and foot: Secondary | ICD-10-CM

## 2023-09-14 DIAGNOSIS — M47816 Spondylosis without myelopathy or radiculopathy, lumbar region: Secondary | ICD-10-CM | POA: Diagnosis present

## 2023-09-14 DIAGNOSIS — M7061 Trochanteric bursitis, right hip: Secondary | ICD-10-CM

## 2023-09-14 DIAGNOSIS — R5383 Other fatigue: Secondary | ICD-10-CM

## 2023-09-14 DIAGNOSIS — M19042 Primary osteoarthritis, left hand: Secondary | ICD-10-CM | POA: Diagnosis present

## 2023-09-14 DIAGNOSIS — M25512 Pain in left shoulder: Secondary | ICD-10-CM

## 2023-09-14 DIAGNOSIS — N281 Cyst of kidney, acquired: Secondary | ICD-10-CM

## 2023-09-14 DIAGNOSIS — Z5181 Encounter for therapeutic drug level monitoring: Secondary | ICD-10-CM | POA: Diagnosis present

## 2023-09-14 DIAGNOSIS — Z8719 Personal history of other diseases of the digestive system: Secondary | ICD-10-CM

## 2023-09-14 DIAGNOSIS — E559 Vitamin D deficiency, unspecified: Secondary | ICD-10-CM

## 2023-09-14 DIAGNOSIS — I700 Atherosclerosis of aorta: Secondary | ICD-10-CM

## 2023-09-14 DIAGNOSIS — M810 Age-related osteoporosis without current pathological fracture: Secondary | ICD-10-CM

## 2023-09-14 DIAGNOSIS — Z87442 Personal history of urinary calculi: Secondary | ICD-10-CM

## 2023-09-14 DIAGNOSIS — M19041 Primary osteoarthritis, right hand: Secondary | ICD-10-CM

## 2023-09-14 DIAGNOSIS — M7062 Trochanteric bursitis, left hip: Secondary | ICD-10-CM | POA: Diagnosis present

## 2023-09-14 DIAGNOSIS — Z840 Family history of diseases of the skin and subcutaneous tissue: Secondary | ICD-10-CM | POA: Diagnosis present

## 2023-09-14 DIAGNOSIS — M19072 Primary osteoarthritis, left ankle and foot: Secondary | ICD-10-CM

## 2023-09-14 DIAGNOSIS — R768 Other specified abnormal immunological findings in serum: Secondary | ICD-10-CM | POA: Diagnosis present

## 2023-09-14 DIAGNOSIS — R911 Solitary pulmonary nodule: Secondary | ICD-10-CM

## 2023-09-14 NOTE — Progress Notes (Signed)
 Pharmacy Note  Subjective:  Patient presents today to [facility] for follow up office visit.  Patient was seen by the pharmacist for counseling on Prolia and zolendronate.  Objective: CMP     Component Value Date/Time   NA 142 01/16/2020 0000   K 4.3 01/16/2020 0000   CL 104 01/16/2020 0000   CO2 28 01/16/2020 0000   GLUCOSE 95 01/16/2020 0000   BUN 14 01/16/2020 0000   CREATININE 0.68 01/16/2020 0000   CALCIUM 9.7 01/16/2020 0000   PROT 7.0 01/16/2020 0000   PROT 6.8 01/16/2020 0000   ALBUMIN 4.4 01/23/2012 1634   AST 11 01/16/2020 0000   ALT 14 01/16/2020 0000   ALKPHOS 106 01/23/2012 1634   BILITOT 0.6 01/16/2020 0000   GFRNONAA 85 01/16/2020 0000   GFRAA 98 01/16/2020 0000    Vitamin D  Lab Results  Component Value Date   VD25OH 58 04/30/2020    Most current DEXA scan on 09/09/2022 showed T-score is minus 3.2 at the left femoral neck.  Assessment/Plan:   Counseled patient that Reclast is an IV bisphosphonate that reduces bone turnover by inhibiting osteoclasts that chew up bone.  Counseled patient on purpose, proper use, and adverse effects of Reclast.  Reviewed adverse events of Reclast including risk of nausea & diarrhea, headache, and muscle & bone pain.  Reviewed rare adverse effect of osteonecrosis of the jaw and advised patient to alert her dentist that she is on Reclast prior to any major dental work.  Patient confirms she does not have any major dental work scheduled at this time.    Reviewed importance of taking calcium and vitamin D  with bisphosphonate therapy. Recommended daily amount of calcium is 1200mg  and vitamin D  [phone] units.  Advised calcium is better obtained through diet vs supplement.  Counseled about risk of excess calcium supplementation such a kidney stones and increased risk of heart disease.   Provided patient with medication education material and answered all questions..  Reclast is covered 80% by Medicare Part B and the  remaining 20% can be covered by a supplemental plan.  Unable to provide exact co-pay amount as it is billed through medical not pharmacy benefit.  Patient verbalized understanding.   Counseled patient on purpose, proper use, and adverse effects of Prolia.  Counseled patient that Prolia is a medication that must be injected every 6 months by a healthcare professional.  Advised patient to take calcium 1200 mg daily and vitamin D  800 units daily.  Reviewed the most common adverse effects of Prolia including risk of infection, osteonecrosis of the jaw, rash, and muscle/bone pain.  Patient confirms she does not have any major dental work planned at this time.  Reviewed with patient the signs/symptoms of low calcium and advised patient to alert us  if she experiences these symptoms.  Provided patient with medication education material and answered all questions.   Discussed potential costs with patient since she has Medicare Part A & B and not a prescription drug plan however, told patient we won't know cost until after she receives the medication. Patient is not eligible for patient assistance program for Prolia and advised patient that zolendronate may be cheaper since it is a generic and once yearly dosing opposed to biannually. Advised patient she would receive both medications at an infusion center and not here in the clinic. Patient stated she would prefer to receive infusions in [place], [place]. Patient is due for a tooth extraction so will revisit medication choices once that  is completed. Labs are pending today to evaluate eligibility for both medications. Will revisit in 6 months once tooth extraction is complete and determine appropriate therapy based on labs and patient preference.   Oree Mirelez C. Quincy Boy [facility] PharmD Candidate Class of [phone]

## 2023-09-14 NOTE — Patient Instructions (Signed)

## 2023-09-16 LAB — PARATHYROID HORMONE, INTACT (NO CA): PTH: 43 pg/mL (ref 16–77)

## 2023-09-16 LAB — COMPREHENSIVE METABOLIC PANEL WITH GFR
AG Ratio: 1.9 (calc) (ref 1.0–2.5)
ALT: 17 U/L (ref 6–29)
AST: 15 U/L (ref 10–35)
Albumin: 4.5 g/dL (ref 3.6–5.1)
Alkaline phosphatase (APISO): 75 U/L (ref 37–153)
BUN: 12 mg/dL (ref 7–25)
CO2: 30 mmol/L (ref 20–32)
Calcium: 9.6 mg/dL (ref 8.6–10.4)
Chloride: 104 mmol/L (ref 98–110)
Creat: 0.65 mg/dL (ref 0.60–0.95)
Globulin: 2.4 g/dL (ref 1.9–3.7)
Glucose, Bld: 93 mg/dL (ref 65–99)
Potassium: 4.5 mmol/L (ref 3.5–5.3)
Sodium: 142 mmol/L (ref 135–146)
Total Bilirubin: 0.7 mg/dL (ref 0.2–1.2)
Total Protein: 6.9 g/dL (ref 6.1–8.1)
eGFR: 89 mL/min/1.73m2 (ref >=60)

## 2023-09-16 LAB — PROTEIN ELECTROPHORESIS, SERUM, WITH REFLEX
Albumin ELP: 4.4 g/dL (ref 3.8–4.8)
Alpha 1: 0.3 g/dL (ref 0.2–0.3)
Alpha 2: 0.7 g/dL (ref 0.5–0.9)
Beta 2: 0.4 g/dL (ref 0.2–0.5)
Beta Globulin: 0.5 g/dL (ref 0.4–0.6)
Gamma Globulin: 0.8 g/dL (ref 0.8–1.7)
Total Protein: 7.1 g/dL (ref 6.1–8.1)

## 2023-09-16 LAB — CBC WITH DIFFERENTIAL/PLATELET
Absolute Lymphocytes: 1482 {cells}/uL (ref 850–3900)
Absolute Monocytes: 320 {cells}/uL (ref 200–950)
Basophils Absolute: 27 {cells}/uL (ref 0–200)
Basophils Relative: 0.4 %
Eosinophils Absolute: 102 {cells}/uL (ref 15–500)
Eosinophils Relative: 1.5 %
HCT: 40 % (ref 35.0–45.0)
Hemoglobin: 13.3 g/dL (ref 11.7–15.5)
MCH: 28.6 pg (ref 27.0–33.0)
MCHC: 33.3 g/dL (ref 32.0–36.0)
MCV: 86 fL (ref 80.0–100.0)
MPV: 10.1 fL (ref 7.5–12.5)
Monocytes Relative: 4.7 %
Neutro Abs: 4869 {cells}/uL (ref 1500–7800)
Neutrophils Relative %: 71.6 %
Platelets: 218 Thousand/uL (ref 140–400)
RBC: 4.65 Million/uL (ref 3.80–5.10)
RDW: 12.5 % (ref 11.0–15.0)
Total Lymphocyte: 21.8 %
WBC: 6.8 Thousand/uL (ref 3.8–10.8)

## 2023-09-16 LAB — VITAMIN D 25 HYDROXY (VIT D DEFICIENCY, FRACTURES): Vit D, 25-Hydroxy: 35 ng/mL (ref 30–100)

## 2023-09-16 LAB — PHOSPHORUS: Phosphorus: 4.5 mg/dL — ABNORMAL HIGH (ref 2.1–4.3)

## 2023-09-16 LAB — TSH: TSH: 1.24 m[IU]/L (ref 0.40–4.50)

## 2023-09-17 ENCOUNTER — Ambulatory Visit: Payer: Self-pay | Admitting: Rheumatology

## 2023-09-17 NOTE — Progress Notes (Signed)
 Phosphorus is mildly elevated, CBC normal, CMP normal, PTH normal, vitamin D  low normal, alkaline phosphatase normal.  She should continue to take vitamin D .  Patient is supposed notify when she wants to start on the treatment for osteoporosis.

## 2024-03-14 ENCOUNTER — Ambulatory Visit: Admitting: Rheumatology
